# Patient Record
Sex: Female | Born: 1965 | Race: Black or African American | Hispanic: No | Smoking: Never smoker
Health system: Southern US, Community
[De-identification: ages and names within clinical notes are randomized; demographics above are authoritative.]

## PROBLEM LIST (undated history)

## (undated) DIAGNOSIS — D509 Iron deficiency anemia, unspecified: Secondary | ICD-10-CM

## (undated) DIAGNOSIS — M5010 Cervical disc disorder with radiculopathy, unspecified cervical region: Secondary | ICD-10-CM

## (undated) DIAGNOSIS — F32A Depression, unspecified: Secondary | ICD-10-CM

## (undated) DIAGNOSIS — K219 Gastro-esophageal reflux disease without esophagitis: Secondary | ICD-10-CM

## (undated) DIAGNOSIS — F5000 Anorexia nervosa, unspecified: Secondary | ICD-10-CM

## (undated) DIAGNOSIS — I10 Essential (primary) hypertension: Secondary | ICD-10-CM

## (undated) DIAGNOSIS — Z8669 Personal history of other diseases of the nervous system and sense organs: Secondary | ICD-10-CM

## (undated) DIAGNOSIS — F4310 Post-traumatic stress disorder, unspecified: Secondary | ICD-10-CM

## (undated) DIAGNOSIS — F329 Major depressive disorder, single episode, unspecified: Secondary | ICD-10-CM

## (undated) HISTORY — DX: Cervical disc disorder with radiculopathy, unspecified cervical region: M50.10

## (undated) HISTORY — DX: Depression, unspecified: F32.A

## (undated) HISTORY — DX: Personal history of other diseases of the nervous system and sense organs: Z86.69

## (undated) HISTORY — DX: Major depressive disorder, single episode, unspecified: F32.9

## (undated) HISTORY — DX: Gastro-esophageal reflux disease without esophagitis: K21.9

## (undated) HISTORY — DX: Post-traumatic stress disorder, unspecified: F43.10

## (undated) HISTORY — DX: Iron deficiency anemia, unspecified: D50.9

## (undated) HISTORY — PX: CEREBRAL ANEURYSM REPAIR: SHX164

## (undated) HISTORY — DX: Anorexia nervosa, unspecified: F50.00

---

## 2009-03-13 ENCOUNTER — Encounter: Admission: RE | Admit: 2009-03-13 | Discharge: 2009-03-13 | Payer: Self-pay | Admitting: Neurology

## 2009-08-22 ENCOUNTER — Ambulatory Visit: Payer: Self-pay | Admitting: Gastroenterology

## 2009-08-22 ENCOUNTER — Encounter: Payer: Self-pay | Admitting: Gastroenterology

## 2009-08-22 DIAGNOSIS — K219 Gastro-esophageal reflux disease without esophagitis: Secondary | ICD-10-CM | POA: Insufficient documentation

## 2009-08-22 DIAGNOSIS — Z862 Personal history of diseases of the blood and blood-forming organs and certain disorders involving the immune mechanism: Secondary | ICD-10-CM | POA: Insufficient documentation

## 2009-08-22 DIAGNOSIS — R1310 Dysphagia, unspecified: Secondary | ICD-10-CM | POA: Insufficient documentation

## 2009-08-22 DIAGNOSIS — R112 Nausea with vomiting, unspecified: Secondary | ICD-10-CM | POA: Insufficient documentation

## 2009-08-22 DIAGNOSIS — R1031 Right lower quadrant pain: Secondary | ICD-10-CM | POA: Insufficient documentation

## 2009-08-22 DIAGNOSIS — K5909 Other constipation: Secondary | ICD-10-CM | POA: Insufficient documentation

## 2009-08-24 ENCOUNTER — Encounter: Payer: Self-pay | Admitting: Gastroenterology

## 2009-08-28 ENCOUNTER — Ambulatory Visit (HOSPITAL_COMMUNITY): Admission: RE | Admit: 2009-08-28 | Discharge: 2009-08-28 | Payer: Self-pay | Admitting: Gastroenterology

## 2009-09-06 ENCOUNTER — Ambulatory Visit: Payer: Self-pay | Admitting: Gastroenterology

## 2009-09-06 ENCOUNTER — Ambulatory Visit (HOSPITAL_COMMUNITY): Admission: RE | Admit: 2009-09-06 | Discharge: 2009-09-06 | Payer: Self-pay | Admitting: Gastroenterology

## 2009-09-11 ENCOUNTER — Encounter: Payer: Self-pay | Admitting: Gastroenterology

## 2009-11-21 ENCOUNTER — Encounter (INDEPENDENT_AMBULATORY_CARE_PROVIDER_SITE_OTHER): Payer: Self-pay | Admitting: *Deleted

## 2009-11-30 ENCOUNTER — Ambulatory Visit (HOSPITAL_COMMUNITY): Admission: RE | Admit: 2009-11-30 | Discharge: 2009-11-30 | Payer: Self-pay | Admitting: Family Medicine

## 2009-11-30 ENCOUNTER — Encounter (HOSPITAL_COMMUNITY)
Admission: RE | Admit: 2009-11-30 | Discharge: 2009-12-30 | Payer: Self-pay | Source: Home / Self Care | Admitting: Gastroenterology

## 2009-12-01 ENCOUNTER — Emergency Department (HOSPITAL_COMMUNITY): Admission: EM | Admit: 2009-12-01 | Discharge: 2009-12-01 | Payer: Self-pay | Admitting: Emergency Medicine

## 2009-12-05 ENCOUNTER — Encounter (INDEPENDENT_AMBULATORY_CARE_PROVIDER_SITE_OTHER): Payer: Self-pay | Admitting: *Deleted

## 2009-12-06 ENCOUNTER — Ambulatory Visit (HOSPITAL_COMMUNITY): Admission: RE | Admit: 2009-12-06 | Discharge: 2009-12-06 | Payer: Self-pay | Admitting: Interventional Radiology

## 2009-12-10 ENCOUNTER — Encounter: Payer: Self-pay | Admitting: Interventional Radiology

## 2009-12-31 ENCOUNTER — Inpatient Hospital Stay (HOSPITAL_COMMUNITY): Admission: RE | Admit: 2009-12-31 | Discharge: 2010-01-01 | Payer: Self-pay | Admitting: Interventional Radiology

## 2010-01-01 ENCOUNTER — Encounter (INDEPENDENT_AMBULATORY_CARE_PROVIDER_SITE_OTHER): Payer: Self-pay | Admitting: Cardiovascular Disease

## 2010-01-14 ENCOUNTER — Ambulatory Visit (HOSPITAL_COMMUNITY)
Admission: RE | Admit: 2010-01-14 | Discharge: 2010-01-14 | Payer: Self-pay | Source: Home / Self Care | Attending: Interventional Radiology | Admitting: Interventional Radiology

## 2010-01-16 ENCOUNTER — Ambulatory Visit (HOSPITAL_COMMUNITY)
Admission: RE | Admit: 2010-01-16 | Discharge: 2010-01-16 | Payer: Self-pay | Source: Home / Self Care | Attending: Interventional Radiology | Admitting: Interventional Radiology

## 2010-02-23 ENCOUNTER — Encounter: Payer: Self-pay | Admitting: Family Medicine

## 2010-02-24 ENCOUNTER — Encounter: Payer: Self-pay | Admitting: Family Medicine

## 2010-02-25 ENCOUNTER — Ambulatory Visit: Admit: 2010-02-25 | Payer: Self-pay | Admitting: Gastroenterology

## 2010-03-05 NOTE — Letter (Signed)
Summary: CT order   CT order   Imported By: Peggyann Shoals 08/22/2009 15:24:14  _____________________________________________________________________  External Attachment:    Type:   Image     Comment:   External Document
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
## 2010-03-05 NOTE — Letter (Signed)
Summary: TCS & EGD order   TCS & EGD order   Imported By: Peggyann Shoals 08/22/2009 15:08:54  _____________________________________________________________________  External Attachment:    Type:   Image     Comment:   External Document

## 2010-03-05 NOTE — Letter (Signed)
Summary: Sitz Marker order   Sitz Marker order   Imported By: Peggyann Shoals 09/11/2009 11:28:08  _____________________________________________________________________  External Attachment:    Type:   Image     Comment:   External Document

## 2010-03-05 NOTE — Letter (Signed)
Summary: Radiology Test Reminder  [facility]  [address]   [place], [place] [address]   Phone: [phone]  Fax: [phone]     November 21, 2009   [facility] [address] [place], [place]  [address] 11/18/1965  Dear Ms. Nedra Hai,  During your last appointment, your doctor requested you have a Sitz Marker Study.  Our records indicate you have not had this done.  Remember it is very important to follow your doctor's instructions.  Please have this done as soon as possible.  If you have questions regarding this appointment, please call our office and we can reschedule this for you.  It is important that patients and their doctor work together in the management and treatment of their health care.  If you have already had your test done, please disregard this letter.  Thank you,    Ave Filter  [facility] Ph: [phone]   Fax: [phone]   Appended Document: Radiology Test Reminder Pt called and stated she will go to the hospital this week to have her Sitz Marker Study.Marland KitchenMarland KitchenI informed the pt she didn't need an appt for this type of test.

## 2010-03-05 NOTE — Letter (Signed)
Summary: insurance confirmation  insurance confirmation   Imported By: Minna Merritts 08/24/2009 17:38:45  _____________________________________________________________________  External Attachment:    Type:   Image     Comment:   External Document

## 2010-03-05 NOTE — Letter (Signed)
Summary: Recall Office Visit  [facility]  [address]   [place], [place] [address]   Phone: [phone]  Fax: [phone]      December 05, 2009   [facility] [place] [address] [place], [place]  [address] March 14, 1965   Dear Ms. Nedra Hai,   According to our records, it is time for you to schedule a follow-up office visit with [place].   At your convenience, please call [phone] to schedule an office visit. If you have any questions, concerns, or feel that this letter is in error, we would appreciate your call.   Sincerely,    Diana Eves  [facility] Ph: [phone]   Fax: [phone]

## 2010-03-29 ENCOUNTER — Other Ambulatory Visit (HOSPITAL_COMMUNITY): Payer: Self-pay | Admitting: Interventional Radiology

## 2010-03-29 DIAGNOSIS — I729 Aneurysm of unspecified site: Secondary | ICD-10-CM

## 2010-04-16 LAB — CARDIAC PANEL(CRET KIN+CKTOT+MB+TROPI)
Relative Index: INVALID (ref 0.0–2.5)
Relative Index: INVALID (ref 0.0–2.5)
Total CK: 49 U/L (ref 7–177)
Total CK: 53 U/L (ref 7–177)

## 2010-04-16 LAB — BASIC METABOLIC PANEL
BUN: 7 mg/dL (ref 6–23)
BUN: 8 mg/dL (ref 6–23)
CO2: 25 mEq/L (ref 19–32)
Calcium: 8.6 mg/dL (ref 8.4–10.5)
Calcium: 9.4 mg/dL (ref 8.4–10.5)
Calcium: 9.5 mg/dL (ref 8.4–10.5)
Creatinine, Ser: 0.67 mg/dL (ref 0.4–1.2)
Creatinine, Ser: 0.73 mg/dL (ref 0.4–1.2)
GFR calc Af Amer: >60 mL/min (ref >60)
GFR calc Af Amer: >60 mL/min (ref >60)
GFR calc non Af Amer: >60 mL/min (ref >60)
GFR calc non Af Amer: >60 mL/min (ref >60)
Glucose, Bld: 98 mg/dL (ref 70–99)
Sodium: 136 mEq/L (ref 135–145)
Sodium: 138 mEq/L (ref 135–145)

## 2010-04-16 LAB — DIFFERENTIAL
Basophils Absolute: 0 10*3/uL (ref 0.0–0.1)
Eosinophils Absolute: 0.1 10*3/uL (ref 0.0–0.7)
Eosinophils Relative: 1 % (ref 0–5)
Lymphocytes Relative: 46 % (ref 12–46)

## 2010-04-16 LAB — CBC
Hemoglobin: 11.4 g/dL — ABNORMAL LOW (ref 12.0–15.0)
Hemoglobin: 9.2 g/dL — ABNORMAL LOW (ref 12.0–15.0)
Hemoglobin: 9.7 g/dL — ABNORMAL LOW (ref 12.0–15.0)
MCH: 30.2 pg (ref 26.0–34.0)
MCH: 30.3 pg (ref 26.0–34.0)
MCH: 30.4 pg (ref 26.0–34.0)
MCH: 30.5 pg (ref 26.0–34.0)
MCHC: 32.4 g/dL (ref 30.0–36.0)
MCHC: 33.2 g/dL (ref 30.0–36.0)
MCV: 92.7 fL (ref 78.0–100.0)
Platelets: 343 10*3/uL (ref 150–400)
Platelets: 398 10*3/uL (ref 150–400)
Platelets: 417 10*3/uL — ABNORMAL HIGH (ref 150–400)
RBC: 3.03 MIL/uL — ABNORMAL LOW (ref 3.87–5.11)
RBC: 3.9 MIL/uL (ref 3.87–5.11)
RDW: 13.3 % (ref 11.5–15.5)
RDW: 14 % (ref 11.5–15.5)
WBC: 8.2 10*3/uL (ref 4.0–10.5)

## 2010-04-16 LAB — PROTIME-INR
Prothrombin Time: 12.2 seconds (ref 11.6–15.2)
Prothrombin Time: 12.9 seconds (ref 11.6–15.2)
Prothrombin Time: 13 seconds (ref 11.6–15.2)

## 2010-04-16 LAB — GLUCOSE, CAPILLARY: Glucose-Capillary: 105 mg/dL — ABNORMAL HIGH (ref 70–99)

## 2010-04-16 LAB — APTT: aPTT: 29 seconds (ref 24–37)

## 2010-04-17 LAB — POCT I-STAT, CHEM 8
Calcium, Ion: 1.15 mmol/L (ref 1.12–1.32)
Chloride: 106 mEq/L (ref 96–112)
HCT: 39 % (ref 36.0–46.0)
Hemoglobin: 13.3 g/dL (ref 12.0–15.0)
TCO2: 27 mmol/L (ref 0–100)

## 2010-04-17 LAB — URINALYSIS, ROUTINE W REFLEX MICROSCOPIC
Glucose, UA: NEGATIVE mg/dL
Nitrite: NEGATIVE
Protein, ur: NEGATIVE mg/dL
Urobilinogen, UA: 1 mg/dL (ref 0.0–1.0)

## 2010-04-17 LAB — POCT PREGNANCY, URINE: Preg Test, Ur: NEGATIVE

## 2010-04-18 ENCOUNTER — Other Ambulatory Visit (HOSPITAL_COMMUNITY): Payer: Self-pay | Admitting: Interventional Radiology

## 2010-04-18 ENCOUNTER — Ambulatory Visit (HOSPITAL_COMMUNITY)
Admission: RE | Admit: 2010-04-18 | Discharge: 2010-04-18 | Disposition: A | Discharge status: Home / Self Care | Payer: Medicare Other | Source: Ambulatory Visit | Attending: Interventional Radiology | Admitting: Interventional Radiology

## 2010-04-18 ENCOUNTER — Other Ambulatory Visit (HOSPITAL_COMMUNITY): Payer: Self-pay

## 2010-04-18 DIAGNOSIS — I671 Cerebral aneurysm, nonruptured: Secondary | ICD-10-CM | POA: Insufficient documentation

## 2010-04-18 DIAGNOSIS — K219 Gastro-esophageal reflux disease without esophagitis: Secondary | ICD-10-CM | POA: Insufficient documentation

## 2010-04-18 DIAGNOSIS — I729 Aneurysm of unspecified site: Secondary | ICD-10-CM

## 2010-04-18 DIAGNOSIS — I10 Essential (primary) hypertension: Secondary | ICD-10-CM | POA: Insufficient documentation

## 2010-04-18 DIAGNOSIS — R51 Headache: Secondary | ICD-10-CM | POA: Insufficient documentation

## 2010-04-18 DIAGNOSIS — R42 Dizziness and giddiness: Secondary | ICD-10-CM | POA: Insufficient documentation

## 2010-04-18 LAB — PROTIME-INR
INR: 0.87 (ref 0.00–1.49)
Prothrombin Time: 12 seconds (ref 11.6–15.2)

## 2010-04-18 LAB — CBC
Platelets: 371 10*3/uL (ref 150–400)
RDW: 13.1 % (ref 11.5–15.5)
WBC: 8.6 10*3/uL (ref 4.0–10.5)

## 2010-04-18 LAB — COMPREHENSIVE METABOLIC PANEL
Albumin: 4 g/dL (ref 3.5–5.2)
BUN: 9 mg/dL (ref 6–23)
Creatinine, Ser: 0.75 mg/dL (ref 0.4–1.2)
Glucose, Bld: 101 mg/dL — ABNORMAL HIGH (ref 70–99)
Total Protein: 7.2 g/dL (ref 6.0–8.3)

## 2010-04-18 LAB — APTT: aPTT: 26 seconds (ref 24–37)

## 2010-04-18 MED ORDER — IOHEXOL 300 MG/ML  SOLN
150.0000 mL | Freq: Once | INTRAMUSCULAR | Status: AC | PRN
  Administered 2010-04-18: 40 mL via INTRAVENOUS

## 2010-04-20 LAB — BASIC METABOLIC PANEL
BUN: 14 mg/dL (ref 6–23)
Calcium: 9.2 mg/dL (ref 8.4–10.5)
Creatinine, Ser: 0.87 mg/dL (ref 0.4–1.2)
GFR calc non Af Amer: >60 mL/min (ref >60)
Glucose, Bld: 105 mg/dL — ABNORMAL HIGH (ref 70–99)

## 2010-06-10 ENCOUNTER — Ambulatory Visit (INDEPENDENT_AMBULATORY_CARE_PROVIDER_SITE_OTHER): Payer: Medicare Other | Admitting: Gastroenterology

## 2010-06-10 ENCOUNTER — Encounter: Payer: Self-pay | Admitting: Gastroenterology

## 2010-06-10 VITALS — BP 141/68 | HR 95 | Temp 97.6°F | Ht 61.0 in | Wt 161.0 lb

## 2010-06-10 DIAGNOSIS — K5909 Other constipation: Secondary | ICD-10-CM

## 2010-06-10 DIAGNOSIS — A048 Other specified bacterial intestinal infections: Secondary | ICD-10-CM

## 2010-06-10 MED ORDER — OMEPRAZOLE 20 MG PO CPDR: 20.0000 mg | DELAYED_RELEASE_CAPSULE | Freq: Every day | ORAL | Status: AC

## 2010-06-10 NOTE — Assessment & Plan Note (Signed)
Chronic constipation despite Amitiza 24 mcg BID, laxatives, high fiber diet. Did not complete sitz marker study secondary to cerebral aneurysm repair. Will proceed with sitz marker study, add probiotic. Further rec's to follow.

## 2010-06-10 NOTE — Patient Instructions (Signed)
We have set you up for a sitz marker study. We will contact you with results when this is all completed.  Continue the high fiber diet.  Set a goal for 6-8 glasses of water per day.  Start a probiotic: samples have been given to you. (Such as Align, Restora).  Follow-up in 3 months.

## 2010-06-10 NOTE — Progress Notes (Signed)
Referring Provider: Reynolds Bowl, MD Primary Care Physician:  Michele Bowl, MD Primary Gastroenterologist: Dr. Darrick Penna   Chief Complaint  Patient presents with  . Constipation    HPI:   Michele Barrett returns today in f/u for chronic constipation. EGD/TCS performed August 2011: +H.pylori s/p dilation of Schatzki's ring, hyperplastic polyps, internal hemorrhoids. Was set up for sitz marker study but never completed due to finding of cerebral aneurysm. Actually underwent cerebral aneurysm repair in Dec 2011. Returns today without any improvement of constipation Taking Amitiza twice/day. Taking 4 dulcolax pills every night. Correctol once/week (8-10 pills). Doesn't have BM unless takes Correctol.  In mornings feels nauseated, lasts as long as constipated. No dysphagia or GERD. Has cramping secondary to constipation. Following high fiber diet. Taking metamucil. Tries to drink 5 glasses of water/day. Not on a probiotic.   Complains of epigastric pain like a ball in the morning and evening/cramping. "achy". Chewing gum makes it feel better.   Past Medical History  Diagnosis Date  . Vitamin D deficiency   . GERD (gastroesophageal reflux disease)   . Cervical disc syndrome   . Anorexia nervosa     6 years ago diagnosed. wt stable,  . Depression   . PTSD (post-traumatic stress disorder)     since sister's death in 17-Jun-1992 (aneurysm)  . IDA (iron deficiency anemia)   . History of migraine headaches     Past Surgical History  Procedure Date  . Cesarean section     uterus stapled to abd wall, resulting in surgery few days later; cardiac arrest intraop  . Cerebral aneurysm repair     Dec 2011    Current Outpatient Prescriptions  Medication Sig Dispense Refill  . Almotriptan Malate (AXERT PO) Take 0.5 mg by mouth.        . ALPRAZolam (XANAX) 0.5 MG tablet Take 0.5 mg by mouth at bedtime as needed.        Marland Kitchen amitriptyline (ELAVIL) 100 MG tablet Take 100 mg by mouth at bedtime.        Marland Kitchen  aspirin 325 MG tablet Take 325 mg by mouth daily.        . bisacodyl (BISACODYL) 5 MG EC tablet Take 5 mg by mouth daily as needed.        Marland Kitchen buPROPion (WELLBUTRIN SR) 100 MG 12 hr tablet Take 100 mg by mouth 2 (two) times daily.        Marland Kitchen LISINOPRIL PO Take 75 mg by mouth.        . meclizine (ANTIVERT) 25 MG tablet Take 25 mg by mouth 3 (three) times daily as needed.          Allergies as of 06/10/2010  . (No Known Allergies)    Family History  Problem Relation Age of Onset  . Colon cancer Mother 16  . Colon cancer Father 30  . Breast cancer Maternal Aunt   . Cerebral aneurysm Sister 9    History   Social History  . Marital Status: Married    Spouse Name: N/A    Number of Children: N/A  . Years of Education: N/A   Social History Main Topics  . Smoking status: Never Smoker   . Smokeless tobacco: None  . Alcohol Use: No  . Drug Use: No  . Sexually Active: None    Review of Systems: Gen: Denies fever, chills, anorexia. Denies fatigue, weakness, weight loss.  CV: Denies chest pain, palpitations, syncope, peripheral edema, and claudication. Resp: Denies dyspnea at rest,  cough, wheezing, coughing up blood, and pleurisy. GI: Denies vomiting blood, jaundice, and fecal incontinence.   Denies dysphagia or odynophagia. Derm: Denies rash, itching, dry skin Psych: Denies depression, anxiety, memory loss, confusion. No homicidal or suicidal ideation.  Heme: Denies bruising, bleeding, and enlarged lymph nodes.  Physical Exam: BP 141/68  Pulse 95  Temp(Src) 97.6 F (36.4 C) (Tympanic)  Ht 5\' 1"  (1.549 m)  Wt 161 lb (73.029 kg)  BMI 30.42 kg/m2  LMP 05/29/2010 General:   Alert and oriented. No distress noted. Pleasant and cooperative.  Head:  Normocephalic and atraumatic. Eyes:  Conjuctiva clear without scleral icterus. Mouth:  Oral mucosa pink and moist. Good dentition. No lesions. Heart:  S1, S2 present without murmurs, rubs, or gallops. Regular rate and rhythm. Lungs:  Clear to auscultation bilaterally without wheezes, rales, or rhonchi.  Abdomen:  +BS, soft, non-tender and non-distended. No rebound or guarding. No HSM or masses noted. Msk:  Symmetrical without gross deformities. Normal posture. Extremities:  Without edema. Neurologic:  Alert and  oriented x4;  grossly normal neurologically. Skin:  Intact without significant lesions or rashes. Cervical Nodes:  No significant cervical adenopathy. Psych:  Alert and cooperative. Normal mood and affect.

## 2010-06-10 NOTE — Progress Notes (Signed)
Cc to PCP 

## 2010-06-10 NOTE — Assessment & Plan Note (Signed)
Hx of H.pylori gastritis on EGD Aug 2011, completed tx. Returns today off PPI. Complains of epigastric crampy/achy sensation in morning and night. No dysphagia. No GERD. +nausea in morning. Nausea likely r/t constipation component, as this is relieved after BM. However, may also be linked to hx of gastritis. Will check H.pylori stool antigen, then resume PPI daily. F/U in 3 mos with Dr. Darrick Penna.

## 2010-06-21 NOTE — Progress Notes (Signed)
Agree with Sitz Marker. Continue Am itiza.

## 2010-07-02 ENCOUNTER — Encounter: Payer: Self-pay | Admitting: Gastroenterology

## 2010-07-02 NOTE — Progress Notes (Unsigned)
  Pt did not complete H.pylori stool antigen or sitz marker study. I believe this was set up at last appt.  Please have pt complete the stool sample: needs to be off PPI for 2 weeks prior to submitting so that results are not skewed.  Also, sitz marker study needs to be completed.

## 2010-07-02 NOTE — Progress Notes (Signed)
Contacting pt to determine why not completed.

## 2010-07-03 NOTE — Progress Notes (Signed)
LMOM to call.

## 2010-07-03 NOTE — Progress Notes (Signed)
Called, no answer.

## 2010-07-04 NOTE — Progress Notes (Signed)
Attempted to call, all circuits are busy.

## 2010-07-08 NOTE — Progress Notes (Signed)
Pt said she has been off of the PPI. She has not had much BM, is going to take a laxative tonight and see if she can get a stool specimen. Said she was told not to do the Sitz marker until after she completes the H. Pylori specimen.

## 2010-07-08 NOTE — Progress Notes (Signed)
LMOM to call. Mailing a letter to call also.  

## 2010-08-14 ENCOUNTER — Other Ambulatory Visit (HOSPITAL_COMMUNITY): Payer: Self-pay | Admitting: Interventional Radiology

## 2010-08-14 DIAGNOSIS — I729 Aneurysm of unspecified site: Secondary | ICD-10-CM

## 2010-08-20 ENCOUNTER — Ambulatory Visit (HOSPITAL_COMMUNITY)
Admission: RE | Admit: 2010-08-20 | Discharge: 2010-08-20 | Disposition: A | Discharge status: Home / Self Care | Payer: Medicare Other | Source: Ambulatory Visit | Attending: Interventional Radiology | Admitting: Interventional Radiology

## 2010-08-20 ENCOUNTER — Other Ambulatory Visit (HOSPITAL_COMMUNITY): Payer: Self-pay | Admitting: Interventional Radiology

## 2010-08-20 DIAGNOSIS — K219 Gastro-esophageal reflux disease without esophagitis: Secondary | ICD-10-CM | POA: Insufficient documentation

## 2010-08-20 DIAGNOSIS — G43909 Migraine, unspecified, not intractable, without status migrainosus: Secondary | ICD-10-CM | POA: Insufficient documentation

## 2010-08-20 DIAGNOSIS — I671 Cerebral aneurysm, nonruptured: Secondary | ICD-10-CM | POA: Insufficient documentation

## 2010-08-20 DIAGNOSIS — D649 Anemia, unspecified: Secondary | ICD-10-CM | POA: Insufficient documentation

## 2010-08-20 DIAGNOSIS — T82898A Other specified complication of vascular prosthetic devices, implants and grafts, initial encounter: Secondary | ICD-10-CM | POA: Insufficient documentation

## 2010-08-20 DIAGNOSIS — I10 Essential (primary) hypertension: Secondary | ICD-10-CM | POA: Insufficient documentation

## 2010-08-20 DIAGNOSIS — I729 Aneurysm of unspecified site: Secondary | ICD-10-CM

## 2010-08-20 DIAGNOSIS — F411 Generalized anxiety disorder: Secondary | ICD-10-CM | POA: Insufficient documentation

## 2010-08-20 DIAGNOSIS — M129 Arthropathy, unspecified: Secondary | ICD-10-CM | POA: Insufficient documentation

## 2010-08-20 DIAGNOSIS — Z9889 Other specified postprocedural states: Secondary | ICD-10-CM | POA: Insufficient documentation

## 2010-08-20 DIAGNOSIS — Y831 Surgical operation with implant of artificial internal device as the cause of abnormal reaction of the patient, or of later complication, without mention of misadventure at the time of the procedure: Secondary | ICD-10-CM | POA: Insufficient documentation

## 2010-08-20 LAB — POCT I-STAT, CHEM 8
Chloride: 106 mEq/L (ref 96–112)
Creatinine, Ser: 0.7 mg/dL (ref 0.50–1.10)
Glucose, Bld: 101 mg/dL — ABNORMAL HIGH (ref 70–99)
HCT: 38 % (ref 36.0–46.0)
Potassium: 3.4 mEq/L — ABNORMAL LOW (ref 3.5–5.1)
Sodium: 140 mEq/L (ref 135–145)

## 2010-08-20 LAB — CBC
HCT: 33.4 % — ABNORMAL LOW (ref 36.0–46.0)
Hemoglobin: 11.1 g/dL — ABNORMAL LOW (ref 12.0–15.0)
MCHC: 33.2 g/dL (ref 30.0–36.0)
MCV: 90.3 fL (ref 78.0–100.0)
RDW: 13.2 % (ref 11.5–15.5)

## 2010-08-20 MED ORDER — IOHEXOL 300 MG/ML  SOLN
150.0000 mL | Freq: Once | INTRAMUSCULAR | Status: AC | PRN
  Administered 2010-08-20: 73 mL via INTRAVENOUS

## 2010-09-10 ENCOUNTER — Encounter: Payer: Self-pay | Admitting: Gastroenterology

## 2010-09-11 ENCOUNTER — Ambulatory Visit: Payer: Medicare Other | Admitting: Gastroenterology

## 2010-09-11 ENCOUNTER — Telehealth: Payer: Self-pay | Admitting: Gastroenterology

## 2010-09-11 NOTE — Telephone Encounter (Signed)
NOTED

## 2012-03-09 IMAGING — XA IR ANGIO/CAROTID/CERV BI
1 series · 13 of 24 positions shown · IV contrast (IODINE)
Comparison: None.

12/10/2009 - DUPLICATE COPY for exam association in [facility] – No change from original report.
CLINICAL DATA: Severe right-sided headaches. Abnormal MRA of the
 brain. Strong family history of intracranial aneurysms.

 BILATERAL CAROTID ARTERIOGRAPHY AND BILATERAL VERTEBRAL ARTERY
 angiograms.

[Series 300: neuro · 13 of 246 slices shown]
[im 1/246]
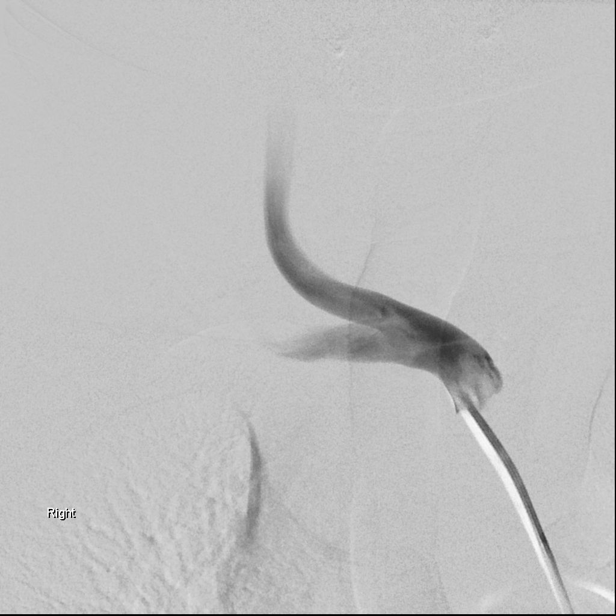
[im 22/246]
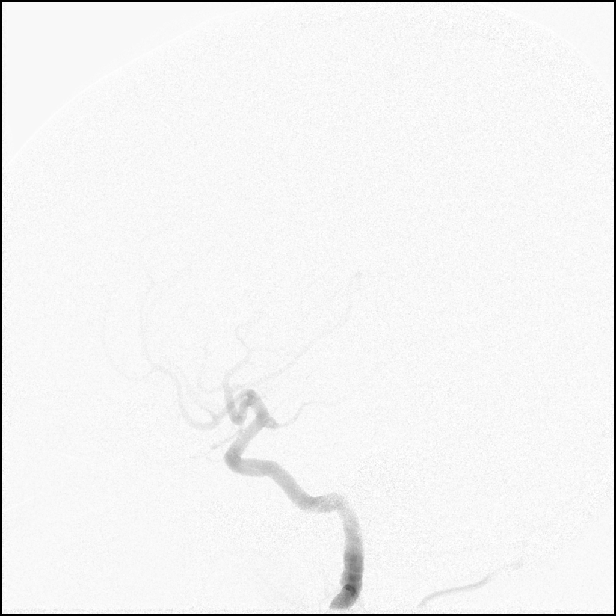
[im 43/246]
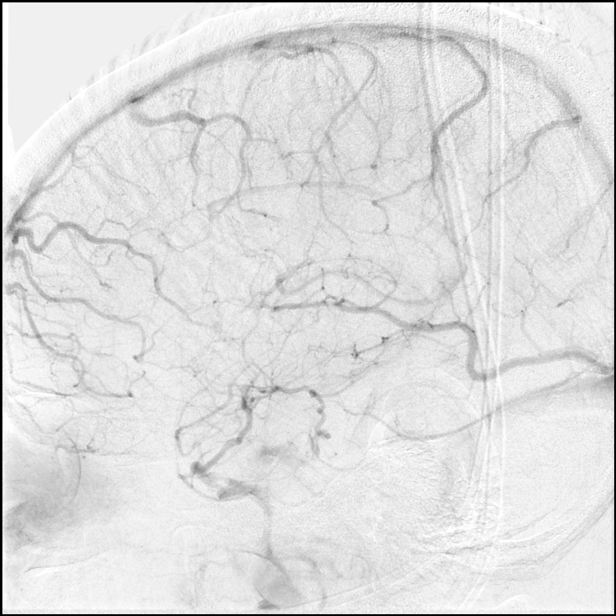
[im 64/246]
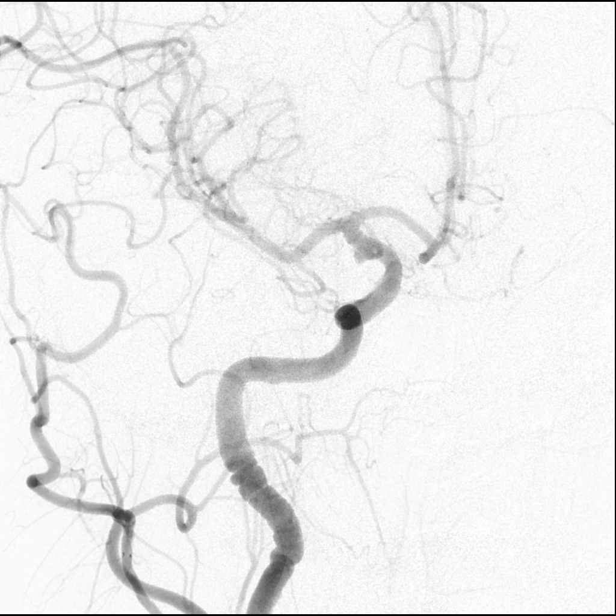
[im 86/246]
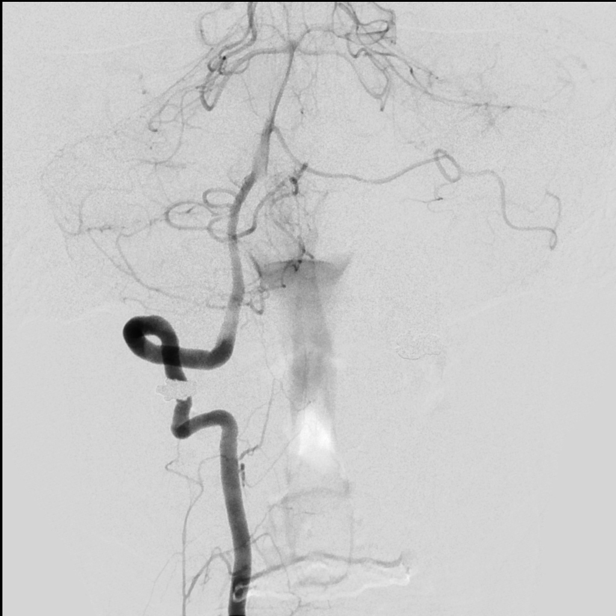
[im 107/246]
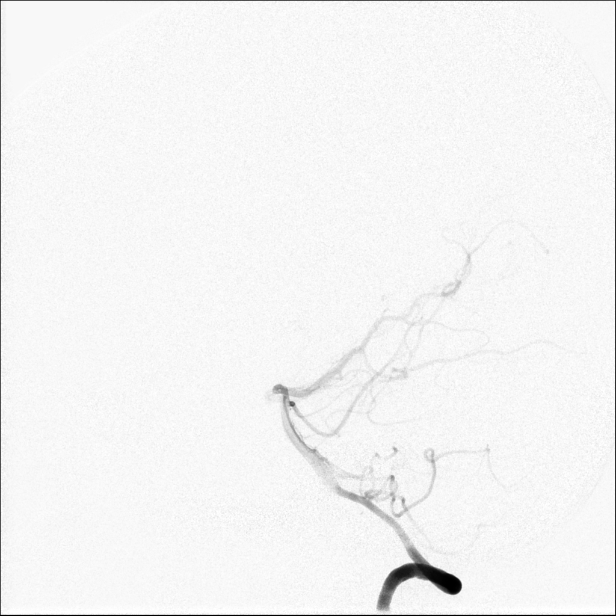
[im 128/246]
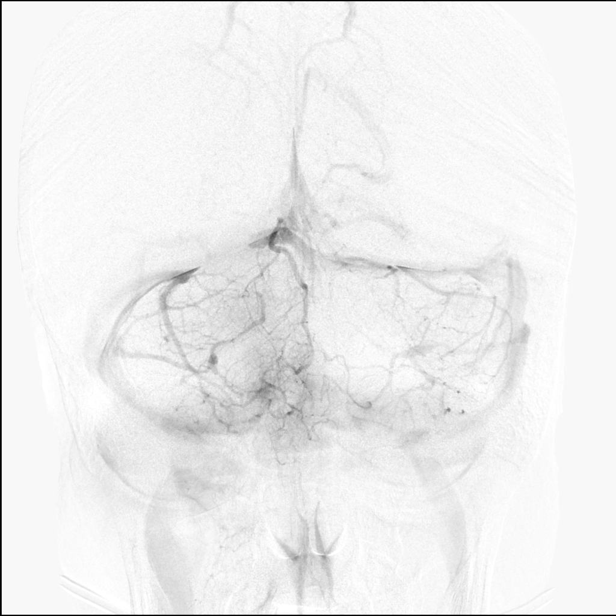
[im 139/246]
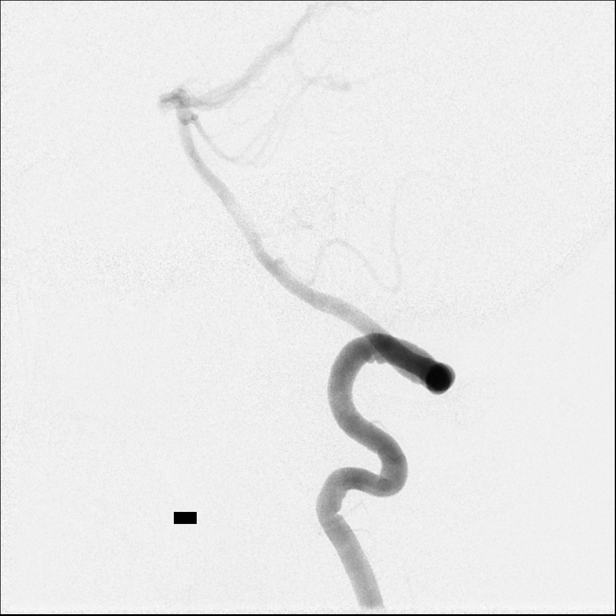
[im 160/246]
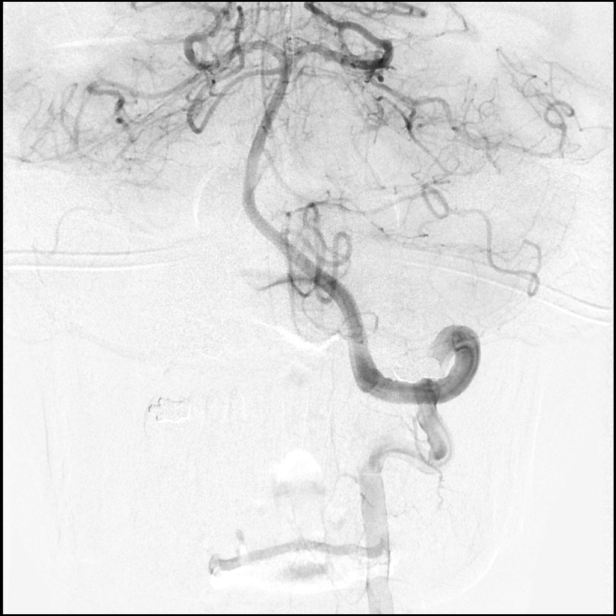
[im 182/246]
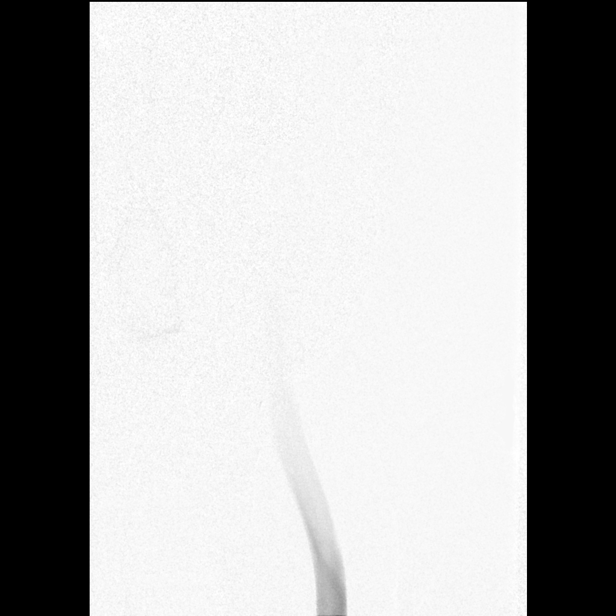
[im 203/246]
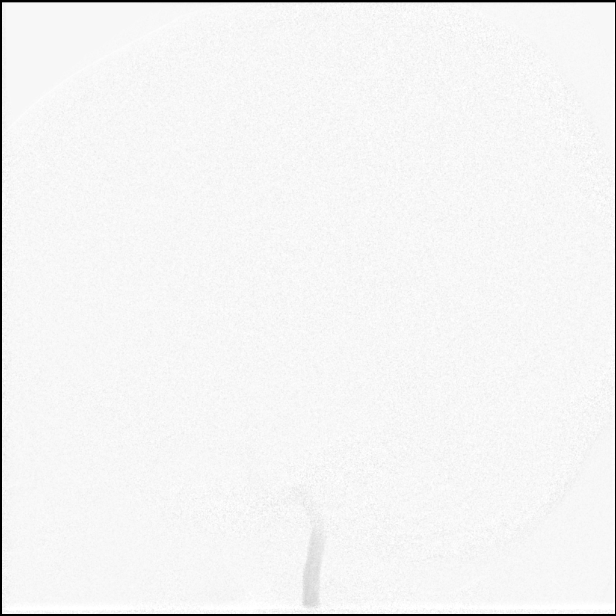
[im 224/246]
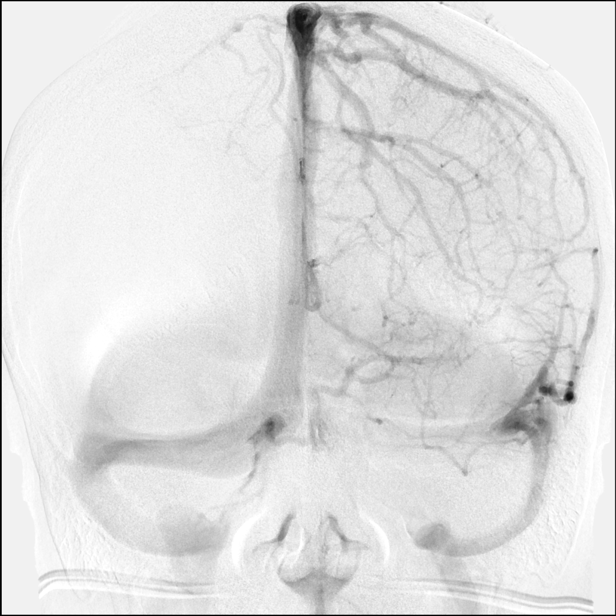
[im 246/246]
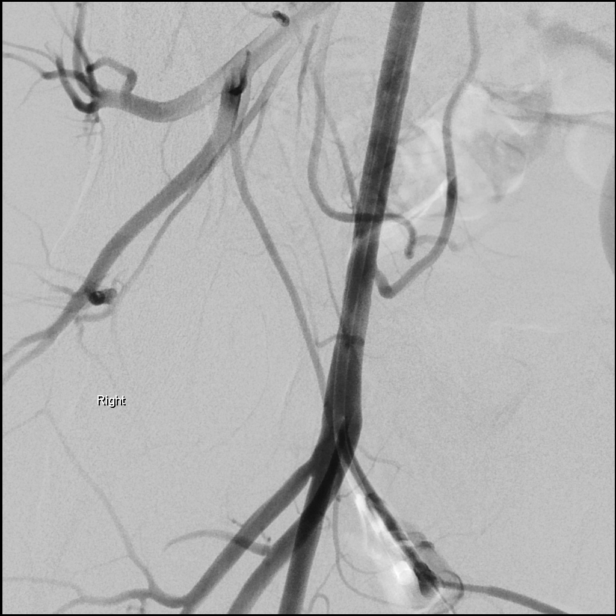

[13 of 24 positions shown; findings below may reference images not displayed]

MRI MRA of the brain of 11/30/2009.

 Following full explanation of procedure along with the potential
 cyst complications, an informed witnessed consent was obtained

 The right groin was prepped and draped in the usual sterile fashion
 thereafter using modified Seldinger technique, transfemoral access
 into the right common femoral artery was obtained without
 difficulty. Over a 0.035-inch guidewire a 5-French Pinnacle sheath
 was inserted. Through this and also over a 0.035-inch guidewire, 5
 French angiogram catheter was advanced to the aortic arch region
 and slight positioned in the right common carotid artery, the right
 vertebral artery, the left common carotid artery and the left
 vertebral artery.

 There were no acute complications. The the patient tolerated the
 procedure well

 Medications utilized Versed 1 mg IV. Fentanyl 25 mcg IV. Contrast
 Omnipaque approximately 65 ml.
FINDINGS: The right common carotid and demonstrates the right external
 carotid artery its major branches to be normal

 The right internal carotid at the bulb in its proximal to thirds is
 normal

 The distal one thirdhas a focal area of smooth irregularity without
 associated stenoses representing mild FMD.

 Distal to this the vessel is seen to opacify normally into the
 petrous cavernous and supraclinoid segments.

 Arising in the right posterior communicating region is a saccular
 aneurysm projecting posteriorly measuring approximately 3.5 mm x 3
 mm. A right posterior communicating artery is seen to arise
 separately from the neck of the aneurysm inferomedially.

 The right middle and the right anterior cerebral arteries are seen
 to opacify normally into the capillary and venous phases

 Cross opacification via the anterior communicating artery of the
 left anterior cerebral A2 segment is noted

 The right vertebral artery origin is normal. The vessel opacifies
 normally to the cranial skull base

 There is normal opacification of the right post inferior cerebellar
 and the right vertebrobasilar junction

 The basilar artery, the opacified portions of the posterior
 cerebral arteries, superior cerebellar arteries and the anterior-
 inferior cerebellar arteries is normal into capillary venous
 phases. Unopacified blood is seen in the basilar artery from a
 contralateral vertebral artery

 The left vertebral artery origin is normal. The vessel opacifies
 normally to the cranial skull base

 The left post inferior cerebellar artery and the left
 vertebrobasilar junction are normal

 The basilar artery, the posterior cerebral arteries, the superior
 cerebellar arteries and the anterior-inferior cerebellar arteries
 opacify normally into capillary venous phases

 The left common carotid demonstrates the left external carotid
 artery and major branches to be normal. The left internal carotid
 at the bulb to the cranial skull base is normal

 The petrous, the cavernous and the supraclinoid segments are
 normally opacified

 At the left posterior communicating artery is noted with associated
 small infundibulum.

 The left middle and the left anterior cerebral arteries opacify
 normally into the capillary and venous phases.

 Impression
 1. Approximately 3.5 mm x 3 mm saccular aneurysm arising in the
 right posterior communicating artery region.

 The above findings were discussed with the patient and the
 patient's family.

## 2012-12-31 ENCOUNTER — Ambulatory Visit (HOSPITAL_COMMUNITY)
Admission: RE | Admit: 2012-12-31 | Discharge: 2012-12-31 | Disposition: A | Discharge status: Home / Self Care | Payer: Medicare Other | Source: Ambulatory Visit | Attending: Interventional Radiology | Admitting: Interventional Radiology

## 2012-12-31 ENCOUNTER — Other Ambulatory Visit (HOSPITAL_COMMUNITY): Payer: Self-pay | Admitting: Interventional Radiology

## 2012-12-31 DIAGNOSIS — R42 Dizziness and giddiness: Secondary | ICD-10-CM

## 2012-12-31 DIAGNOSIS — IMO0001 Reserved for inherently not codable concepts without codable children: Secondary | ICD-10-CM

## 2012-12-31 DIAGNOSIS — R202 Paresthesia of skin: Secondary | ICD-10-CM

## 2012-12-31 DIAGNOSIS — R519 Headache, unspecified: Secondary | ICD-10-CM

## 2012-12-31 DIAGNOSIS — R200 Anesthesia of skin: Secondary | ICD-10-CM

## 2012-12-31 DIAGNOSIS — I729 Aneurysm of unspecified site: Secondary | ICD-10-CM

## 2013-01-04 ENCOUNTER — Other Ambulatory Visit (HOSPITAL_COMMUNITY): Payer: Self-pay | Admitting: Interventional Radiology

## 2013-01-04 DIAGNOSIS — R519 Headache, unspecified: Secondary | ICD-10-CM

## 2013-01-04 DIAGNOSIS — R42 Dizziness and giddiness: Secondary | ICD-10-CM

## 2013-01-05 ENCOUNTER — Other Ambulatory Visit: Payer: Self-pay | Admitting: Radiology

## 2013-01-06 ENCOUNTER — Other Ambulatory Visit: Payer: Self-pay | Admitting: Radiology

## 2013-01-07 ENCOUNTER — Ambulatory Visit (HOSPITAL_COMMUNITY)
Admission: RE | Admit: 2013-01-07 | Discharge: 2013-01-07 | Disposition: A | Discharge status: Home / Self Care | Payer: Medicare Other | Source: Ambulatory Visit | Attending: Interventional Radiology | Admitting: Interventional Radiology

## 2013-01-07 ENCOUNTER — Encounter (HOSPITAL_COMMUNITY): Payer: Self-pay

## 2013-01-07 ENCOUNTER — Other Ambulatory Visit (HOSPITAL_COMMUNITY): Payer: Self-pay | Admitting: Interventional Radiology

## 2013-01-07 DIAGNOSIS — K219 Gastro-esophageal reflux disease without esophagitis: Secondary | ICD-10-CM | POA: Insufficient documentation

## 2013-01-07 DIAGNOSIS — I671 Cerebral aneurysm, nonruptured: Secondary | ICD-10-CM | POA: Insufficient documentation

## 2013-01-07 DIAGNOSIS — R42 Dizziness and giddiness: Secondary | ICD-10-CM

## 2013-01-07 DIAGNOSIS — R51 Headache: Secondary | ICD-10-CM | POA: Insufficient documentation

## 2013-01-07 DIAGNOSIS — I10 Essential (primary) hypertension: Secondary | ICD-10-CM | POA: Insufficient documentation

## 2013-01-07 DIAGNOSIS — R519 Headache, unspecified: Secondary | ICD-10-CM

## 2013-01-07 DIAGNOSIS — Z9889 Other specified postprocedural states: Secondary | ICD-10-CM | POA: Insufficient documentation

## 2013-01-07 DIAGNOSIS — I651 Occlusion and stenosis of basilar artery: Secondary | ICD-10-CM | POA: Insufficient documentation

## 2013-01-07 DIAGNOSIS — R112 Nausea with vomiting, unspecified: Secondary | ICD-10-CM | POA: Insufficient documentation

## 2013-01-07 LAB — BASIC METABOLIC PANEL
BUN: 14 mg/dL (ref 6–23)
CO2: 27 mEq/L (ref 19–32)
Calcium: 9.3 mg/dL (ref 8.4–10.5)
Chloride: 104 mEq/L (ref 96–112)
Creatinine, Ser: 0.91 mg/dL (ref 0.50–1.10)
GFR calc non Af Amer: 73 mL/min — ABNORMAL LOW (ref >90)

## 2013-01-07 LAB — CBC
HCT: 34.7 % — ABNORMAL LOW (ref 36.0–46.0)
MCH: 30.4 pg (ref 26.0–34.0)
MCHC: 33.4 g/dL (ref 30.0–36.0)
MCV: 91.1 fL (ref 78.0–100.0)
Platelets: 346 10*3/uL (ref 150–400)
RBC: 3.81 MIL/uL — ABNORMAL LOW (ref 3.87–5.11)
RDW: 14 % (ref 11.5–15.5)
WBC: 7.2 10*3/uL (ref 4.0–10.5)

## 2013-01-07 MED ORDER — HYDRALAZINE HCL 20 MG/ML IJ SOLN
INTRAMUSCULAR | Status: AC | PRN
  Administered 2013-01-07 (×3): 5 mg via INTRAVENOUS

## 2013-01-07 MED ORDER — MIDAZOLAM HCL 2 MG/2ML IJ SOLN
INTRAMUSCULAR | Status: AC | PRN
  Administered 2013-01-07 (×2): 1 mg via INTRAVENOUS

## 2013-01-07 MED ORDER — FENTANYL CITRATE 0.05 MG/ML IJ SOLN
INTRAMUSCULAR | Status: AC | PRN
  Administered 2013-01-07 (×2): 25 ug via INTRAVENOUS

## 2013-01-07 MED ORDER — ONDANSETRON HCL 4 MG/2ML IJ SOLN
INTRAMUSCULAR | Status: AC
  Administered 2013-01-07: 4 mg via INTRAVENOUS
  Filled 2013-01-07: qty 2

## 2013-01-07 MED ORDER — SODIUM CHLORIDE 0.9 % IV SOLN: INTRAVENOUS | Status: AC

## 2013-01-07 MED ORDER — SODIUM CHLORIDE 0.9 % IV SOLN: Freq: Once | INTRAVENOUS | Status: DC

## 2013-01-07 MED ORDER — HEPARIN SODIUM (PORCINE) 1000 UNIT/ML IJ SOLN
INTRAMUSCULAR | Status: AC | PRN
  Administered 2013-01-07: 1000 [IU] via INTRAVENOUS
  Administered 2013-01-07: 500 [IU] via INTRAVENOUS

## 2013-01-07 MED ORDER — HYDRALAZINE HCL 20 MG/ML IJ SOLN
INTRAMUSCULAR | Status: AC
  Filled 2013-01-07: qty 1

## 2013-01-07 MED ORDER — FENTANYL CITRATE 0.05 MG/ML IJ SOLN
INTRAMUSCULAR | Status: AC
  Filled 2013-01-07: qty 2

## 2013-01-07 MED ORDER — MIDAZOLAM HCL 2 MG/2ML IJ SOLN
INTRAMUSCULAR | Status: AC
  Filled 2013-01-07: qty 2

## 2013-01-07 MED ORDER — PROMETHAZINE HCL 25 MG/ML IJ SOLN
12.5000 mg | Freq: Once | INTRAMUSCULAR | Status: AC
  Administered 2013-01-07: 12.5 mg via INTRAVENOUS
  Filled 2013-01-07: qty 1

## 2013-01-07 MED ORDER — ONDANSETRON HCL 4 MG/2ML IJ SOLN: 4.0000 mg | Freq: Once | INTRAMUSCULAR | Status: DC

## 2013-01-07 MED ORDER — IOHEXOL 300 MG/ML  SOLN
150.0000 mL | Freq: Once | INTRAMUSCULAR | Status: AC | PRN
  Administered 2013-01-07: 75 mL via INTRA_ARTERIAL

## 2013-01-07 NOTE — H&P (Signed)
Michele Barrett is an 47 y.o. female.   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 Vitamin D deficiency   . GERD (gastroesophageal reflux disease)   . Cervical disc syndrome   . Anorexia nervosa     6 years ago diagnosed. wt stable,  . Depression   . PTSD (post-traumatic stress disorder)     since sister's death in 06/15/1992 (aneurysm)  . IDA (iron deficiency anemia)   . History of migraine headaches     Past Surgical History  Procedure Laterality Date  . Cesarean section      uterus stapled to abd wall, resulting in surgery few days later; cardiac arrest intraop  . Cerebral aneurysm repair      Dec 2011    Family History  Problem Relation Age of Onset  . Colon cancer Mother 89  . Colon cancer Father 64  . Breast cancer Maternal Aunt   . Cerebral aneurysm Sister 9   Social History:  reports that she has never smoked. She does not have any smokeless tobacco history on file. She reports that she does not drink alcohol or use illicit drugs.  Allergies:  Allergies  Allergen Reactions  . Aspirin Nausea And Vomiting     (Not in a hospital admission)  Results for orders placed during the hospital encounter of 01/07/13 (from the past 48 hour(s))  APTT     Status: None   Collection Time    01/07/13  7:55 AM      Result Value Range   aPTT 28  24 - 37 seconds  CBC     Status: Abnormal   Collection Time    01/07/13  7:55 AM      Result Value Range   WBC 7.2  4.0 - 10.5 K/uL   RBC 3.81 (*) 3.87 - 5.11 MIL/uL   Hemoglobin 11.6 (*) 12.0 - 15.0 g/dL   HCT 21.3 (*) 08.6 - 57.8  %   MCV 91.1  78.0 - 100.0 fL   MCH 30.4  26.0 - 34.0 pg   MCHC 33.4  30.0 - 36.0 g/dL   RDW 46.9  62.9 - 52.8 %   Platelets 346  150 - 400 K/uL  PROTIME-INR     Status: None   Collection Time    01/07/13  7:55 AM      Result Value Range   Prothrombin Time 11.7  11.6 - 15.2 seconds   INR 0.87  0.00 - 1.49   No results found.  Review of Systems  Constitutional: Negative for fever and weight loss.  Eyes: Negative for blurred vision.  Respiratory: Negative for sputum production and shortness of breath.   Cardiovascular: Negative for chest pain.  Gastrointestinal: Negative for nausea and vomiting.  Neurological: Positive for dizziness, weakness and headaches.    Blood pressure 136/84, pulse 83, temperature 97.9 F (36.6 C), temperature source Oral, resp. rate 18, height 5' (1.524 m), weight 160 lb (72.576 kg), SpO2 97.00%. Physical Exam  Constitutional: She is oriented to person, place, and time. She appears well-developed and well-nourished.  Cardiovascular: Normal rate,  regular rhythm and normal heart sounds.   No murmur heard. Respiratory: Effort normal and breath sounds normal. She has no wheezes.  GI: Soft. Bowel sounds are normal. She exhibits no distension. There is no tenderness.  Musculoskeletal: Normal range of motion.  Neurological: She is alert and oriented to person, place, and time. Coordination normal.  Skin: Skin is warm and dry.  Psychiatric: She has a normal mood and affect. Her behavior is normal. Judgment and thought content normal.     Assessment/Plan Headaches and N/V x 2 weeks High BP x 2 months despite meds [facility] ER visit last week MRI/MRA reveals new L carotic artery intracranial stenosis  Pt with known hx R PCOM artery aneurysm coiling/stent 2011 PMD has seen pt and changed BP meds---now much better  Scheduled now for cerebral arteriogram to eval L carotid intracranial stenosis Pt aware of procedure benefits and risks and agreeable to  proceed Consent signed and in chart  Michele Barrett A 01/07/2013, 8:41 AM

## 2013-01-07 NOTE — ED Notes (Signed)
Instructed per MD to admin Versed 1 mg & Fentanyl 25 mcg IV in attempt to assist with lowering BP as hydralizine alone has not worked.  Additional RX had to be pulled to do as earlier remaining sedation RX was wasted at end of procedure.

## 2013-01-07 NOTE — ED Notes (Signed)
Vital signs stable. 

## 2013-01-07 NOTE — ED Notes (Signed)
Nausea remains

## 2013-01-07 NOTE — ED Notes (Signed)
Pt complain of nausea.  Order requested and received for Zofran 4 mg IV.  Given

## 2013-01-07 NOTE — Procedures (Signed)
S/P 4 vessle cerebral arteriogram RT CFa approach  Findings. 1.Obliterated RT PCom aneurysm,with no coil compaction or recanalization. 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

## 2013-01-07 NOTE — ED Notes (Signed)
Patient denies pain and is resting comfortably.  

## 2013-01-10 ENCOUNTER — Telehealth (HOSPITAL_COMMUNITY): Payer: Self-pay | Admitting: *Deleted

## 2013-01-10 NOTE — Telephone Encounter (Signed)
Post Procedure follow up call.  Spoke with pt.  Says still experiencing some nausea but much improved.  BP up this am, will call to schedule F/U appt with PCP to address this week

## 2013-01-12 ENCOUNTER — Other Ambulatory Visit (HOSPITAL_COMMUNITY): Payer: Self-pay | Admitting: Interventional Radiology

## 2013-01-12 DIAGNOSIS — I729 Aneurysm of unspecified site: Secondary | ICD-10-CM

## 2013-01-12 DIAGNOSIS — I771 Stricture of artery: Secondary | ICD-10-CM

## 2013-01-17 ENCOUNTER — Telehealth (HOSPITAL_COMMUNITY): Payer: Self-pay | Admitting: Interventional Radiology

## 2013-01-17 NOTE — Telephone Encounter (Signed)
Called pt left VM that per Deveswhar she did not need to come in for a 2 wk f/u appt. Told her to call us back immediately is sx developed otherwise I would contact her next year for her f/u MRI/MRA. JM

## 2013-01-20 ENCOUNTER — Ambulatory Visit (HOSPITAL_COMMUNITY): Admission: RE | Admit: 2013-01-20 | Payer: Medicare Other | Source: Ambulatory Visit

## 2014-02-06 ENCOUNTER — Other Ambulatory Visit (HOSPITAL_COMMUNITY): Payer: Self-pay | Admitting: Interventional Radiology

## 2014-02-06 DIAGNOSIS — I671 Cerebral aneurysm, nonruptured: Secondary | ICD-10-CM

## 2014-02-06 DIAGNOSIS — I771 Stricture of artery: Secondary | ICD-10-CM

## 2014-02-22 ENCOUNTER — Encounter (HOSPITAL_COMMUNITY): Payer: Self-pay

## 2014-02-22 ENCOUNTER — Ambulatory Visit (HOSPITAL_COMMUNITY): Payer: Medicare Other

## 2014-02-22 ENCOUNTER — Ambulatory Visit (HOSPITAL_COMMUNITY)
Admission: RE | Admit: 2014-02-22 | Discharge: 2014-02-22 | Disposition: A | Discharge status: Home / Self Care | Payer: Medicare Other | Source: Ambulatory Visit | Attending: Interventional Radiology | Admitting: Interventional Radiology

## 2014-02-22 DIAGNOSIS — I671 Cerebral aneurysm, nonruptured: Secondary | ICD-10-CM | POA: Diagnosis present

## 2014-02-22 DIAGNOSIS — I739 Peripheral vascular disease, unspecified: Secondary | ICD-10-CM | POA: Diagnosis not present

## 2014-02-22 DIAGNOSIS — I771 Stricture of artery: Secondary | ICD-10-CM | POA: Diagnosis not present

## 2014-02-22 HISTORY — DX: Essential (primary) hypertension: I10

## 2014-02-22 LAB — CREATININE, SERUM
Creatinine, Ser: 0.94 mg/dL (ref 0.50–1.10)
GFR calc Af Amer: 81 mL/min — ABNORMAL LOW (ref >90)
GFR, EST NON AFRICAN AMERICAN: 70 mL/min — AB (ref >90)

## 2014-02-22 MED ORDER — GADOBENATE DIMEGLUMINE 529 MG/ML IV SOLN
15.0000 mL | Freq: Once | INTRAVENOUS | Status: AC | PRN
  Administered 2014-02-22: 15 mL via INTRAVENOUS

## 2014-03-06 ENCOUNTER — Telehealth (HOSPITAL_COMMUNITY): Payer: Self-pay | Admitting: Interventional Radiology

## 2014-03-06 NOTE — Telephone Encounter (Signed)
Pt called to ask about her MRI results. I told her per Deveshwar that she would need to follow-up in 2 years time with MRI/MRA. Pt states understanding and is in agreement with this plan of care. JM

## 2015-05-08 ENCOUNTER — Other Ambulatory Visit (HOSPITAL_COMMUNITY): Payer: Self-pay | Admitting: Interventional Radiology

## 2015-05-08 DIAGNOSIS — Z09 Encounter for follow-up examination after completed treatment for conditions other than malignant neoplasm: Secondary | ICD-10-CM

## 2015-05-08 DIAGNOSIS — R51 Headache: Principal | ICD-10-CM

## 2015-05-08 DIAGNOSIS — R519 Headache, unspecified: Secondary | ICD-10-CM

## 2015-05-21 ENCOUNTER — Ambulatory Visit (HOSPITAL_COMMUNITY): Admission: RE | Admit: 2015-05-21 | Payer: Medicare Other | Source: Ambulatory Visit

## 2015-05-31 ENCOUNTER — Ambulatory Visit (HOSPITAL_COMMUNITY): Payer: Medicare Other

## 2015-10-15 ENCOUNTER — Ambulatory Visit: Payer: Medicare Other

## 2015-12-06 ENCOUNTER — Ambulatory Visit: Payer: Medicare Other | Admitting: Audiology

## 2015-12-10 ENCOUNTER — Ambulatory Visit: Payer: Medicare Other | Attending: Audiology | Admitting: Audiology

## 2016-02-22 ENCOUNTER — Telehealth (HOSPITAL_COMMUNITY): Payer: Self-pay

## 2016-02-22 NOTE — Telephone Encounter (Signed)
Called to schedule MRI, no answer. AW

## 2016-02-25 ENCOUNTER — Other Ambulatory Visit (HOSPITAL_COMMUNITY): Payer: Self-pay | Admitting: Interventional Radiology

## 2016-02-25 DIAGNOSIS — I771 Stricture of artery: Secondary | ICD-10-CM

## 2016-02-25 DIAGNOSIS — I729 Aneurysm of unspecified site: Secondary | ICD-10-CM

## 2016-03-05 ENCOUNTER — Ambulatory Visit (HOSPITAL_COMMUNITY)
Admission: RE | Admit: 2016-03-05 | Discharge: 2016-03-05 | Disposition: A | Discharge status: Home / Self Care | Payer: Medicare Other | Source: Ambulatory Visit | Attending: Interventional Radiology | Admitting: Interventional Radiology

## 2016-03-05 ENCOUNTER — Ambulatory Visit (HOSPITAL_COMMUNITY): Admission: RE | Admit: 2016-03-05 | Payer: Medicare Other | Source: Ambulatory Visit

## 2016-03-05 DIAGNOSIS — I671 Cerebral aneurysm, nonruptured: Secondary | ICD-10-CM | POA: Diagnosis not present

## 2016-03-05 DIAGNOSIS — I729 Aneurysm of unspecified site: Secondary | ICD-10-CM | POA: Diagnosis present

## 2016-03-05 DIAGNOSIS — I771 Stricture of artery: Secondary | ICD-10-CM | POA: Insufficient documentation

## 2016-03-05 MED ORDER — GADOBENATE DIMEGLUMINE 529 MG/ML IV SOLN
15.0000 mL | Freq: Once | INTRAVENOUS | Status: AC | PRN
  Administered 2016-03-05: 15 mL via INTRAVENOUS

## 2016-03-06 ENCOUNTER — Telehealth (HOSPITAL_COMMUNITY): Payer: Self-pay

## 2016-03-06 NOTE — Telephone Encounter (Signed)
Called, no answer. AW

## 2016-03-07 ENCOUNTER — Telehealth (HOSPITAL_COMMUNITY): Payer: Self-pay

## 2016-03-07 NOTE — Telephone Encounter (Signed)
Pt agreed to f/u in 2 yrs with mri/mra. AW

## 2016-06-21 ENCOUNTER — Ambulatory Visit (INDEPENDENT_AMBULATORY_CARE_PROVIDER_SITE_OTHER): Payer: Medicare Other | Admitting: Otolaryngology

## 2016-06-23 ENCOUNTER — Ambulatory Visit (INDEPENDENT_AMBULATORY_CARE_PROVIDER_SITE_OTHER): Payer: Medicare Other | Admitting: Otolaryngology

## 2017-07-08 ENCOUNTER — Other Ambulatory Visit (HOSPITAL_BASED_OUTPATIENT_CLINIC_OR_DEPARTMENT_OTHER): Payer: Self-pay

## 2017-07-08 DIAGNOSIS — G4733 Obstructive sleep apnea (adult) (pediatric): Secondary | ICD-10-CM

## 2017-07-14 ENCOUNTER — Ambulatory Visit: Payer: Medicare Other | Attending: Neurology | Admitting: Neurology

## 2017-07-14 ENCOUNTER — Encounter (INDEPENDENT_AMBULATORY_CARE_PROVIDER_SITE_OTHER): Payer: Self-pay

## 2017-07-14 DIAGNOSIS — Z8669 Personal history of other diseases of the nervous system and sense organs: Secondary | ICD-10-CM | POA: Insufficient documentation

## 2017-07-14 DIAGNOSIS — Z09 Encounter for follow-up examination after completed treatment for conditions other than malignant neoplasm: Secondary | ICD-10-CM | POA: Insufficient documentation

## 2017-07-14 DIAGNOSIS — G4733 Obstructive sleep apnea (adult) (pediatric): Secondary | ICD-10-CM

## 2017-07-17 NOTE — Procedures (Signed)
[facility] Faduma Cho A. Gerilyn Pilgrimoonquah, MD     [email or web address]             NOCTURNAL POLYSOMNOGRAPHY   LOCATION: [facility]  Patient Name: Michele Barrett, [name] Study Date: 07/14/2017 Gender: Female D.O.B: 12/26/1965 Age (years): [age] Referring Provider: Beryle BeamsKofi Nicholes Hibler MD, ABSM Height (inches): 61 Interpreting Physician: Beryle BeamsKofi Jezebelle Ledwell MD, ABSM Weight (lbs): 168 RPSGT: Alfonso EllisHedrick, Debra BMI: 32 MRN: [id number] Neck Size: 18.00 <br> <br> CLINICAL INFORMATION Sleep Study Type: NPSG    Indication for sleep study: N/A    Epworth Sleepiness Score: 6    SLEEP STUDY TECHNIQUE As per the AASM Manual for the Scoring of Sleep and Associated Events v2.3 (April 2016) with a hypopnea requiring 4% desaturations.  The channels recorded and monitored were frontal, central and occipital EEG, electrooculogram (EOG), submentalis EMG (chin), nasal and oral airflow, thoracic and abdominal wall motion, anterior tibialis EMG, snore microphone, electrocardiogram, and pulse oximetry.  MEDICATIONS Medications self-administered by patient taken the night of the study : N/A  Current Outpatient Medications:  .  ALPRAZolam (XANAX) 0.5 MG tablet, Take 0.5 mg by mouth at bedtime as needed for sleep. , Disp: , Rfl:  .  amitriptyline (ELAVIL) 100 MG tablet, Take 100 mg by mouth at bedtime.  , Disp: , Rfl:  .  bisacodyl (BISACODYL) 5 MG EC tablet, Take 5 mg by mouth daily as needed for mild constipation. , Disp: , Rfl:  .  buPROPion (WELLBUTRIN SR) 100 MG 12 hr tablet, Take 100 mg by mouth 2 (two) times daily.  , Disp: , Rfl:  .  lisinopril (PRINIVIL,ZESTRIL) 10 MG tablet, Take 10 mg by mouth daily., Disp: , Rfl:  .  meclizine (ANTIVERT) 25 MG tablet, Take 25 mg by mouth 3 (three) times daily as needed for dizziness. , Disp: , Rfl:  .  zolpidem (AMBIEN) 5 MG tablet, Take 5 mg by mouth at bedtime as needed for sleep., Disp: , Rfl:     SLEEP ARCHITECTURE The study was initiated at 10:52:07 PM and  ended at 5:22:50 AM.  Sleep onset time was 11.3 minutes and the sleep efficiency was 84.3%%. The total sleep time was 329.5 minutes.  Stage REM latency was 122.0 minutes.  The patient spent 3.3%% of the night in stage N1 sleep, 77.7%% in stage N2 sleep, 15.3%% in stage N3 and 3.64% in REM.  Alpha intrusion was absent.  Supine sleep was 6.48%.  RESPIRATORY PARAMETERS The overall apnea/hypopnea index (AHI) was 0.0 per hour. There were 0 total apneas, including 0 obstructive, 0 central and 0 mixed apneas. There were 0 hypopneas and 4 RERAs.  The AHI during Stage REM sleep was 0.0 per hour.  AHI while supine was 0.0 per hour.  The mean oxygen saturation was 99.9%. The minimum SpO2 during sleep was 98.0%.  moderate snoring was noted during this study.  CARDIAC DATA The 2 lead EKG demonstrated sinus rhythm. The mean heart rate was N/A beats per minute. Other EKG findings include: None. LEG MOVEMENT DATA MIld PLMS by visional inspection is note. Associated arousal with leg movement index was 4.4.  IMPRESSIONS - No significant obstructive sleep apnea occurred during this study. - No significant central sleep apnea occurred during this study. - The patient had minimal or no oxygen desaturation. - The patient snored with moderate snoring volume. - No cardiac abnormalities were noted during this study. 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   Argie RammingKofi A Tyler Cubit, MD Diplomate, American  Board of Sleep Medicine.   ELECTRONICALLY SIGNED ON:  07/17/2017, 1:17 PM [facility] PH: [phone]   FX: [phone] ACCREDITED BY THE AMERICAN ACADEMY OF SLEEP MEDICINE
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
## 2017-08-25 ENCOUNTER — Ambulatory Visit: Payer: Medicare Other | Admitting: Cardiology

## 2017-08-25 ENCOUNTER — Encounter: Payer: Self-pay | Admitting: *Deleted

## 2017-08-25 NOTE — Progress Notes (Deleted)
Clinical Summary [name] is a 52 y.o.female seen as new consult  1. DOE   Past Medical History:  Diagnosis Date  . Anorexia nervosa    6 years ago diagnosed. wt stable,  . Cervical disc syndrome   . Depression   . GERD (gastroesophageal reflux disease)   . History of migraine headaches   . Hypertension   . IDA (iron deficiency anemia)   . PTSD (post-traumatic stress disorder)    since sister's death in 1994 (aneurysm)  . Vitamin D deficiency      Allergies  Allergen Reactions  . Aspirin Nausea And Vomiting     Current Outpatient Medications  Medication Sig Dispense Refill  . ALPRAZolam (XANAX) 0.5 MG tablet Take 0.5 mg by mouth at bedtime as needed for sleep.     Marland Kitchen. amitriptyline (ELAVIL) 100 MG tablet Take 100 mg by mouth at bedtime.      . bisacodyl (BISACODYL) 5 MG EC tablet Take 5 mg by mouth daily as needed for mild constipation.     Marland Kitchen. buPROPion (WELLBUTRIN SR) 100 MG 12 hr tablet Take 100 mg by mouth 2 (two) times daily.      Marland Kitchen. lisinopril (PRINIVIL,ZESTRIL) 10 MG tablet Take 10 mg by mouth daily.    . meclizine (ANTIVERT) 25 MG tablet Take 25 mg by mouth 3 (three) times daily as needed for dizziness.     Marland Kitchen. zolpidem (AMBIEN) 5 MG tablet Take 5 mg by mouth at bedtime as needed for sleep.     No current facility-administered medications for this visit.      Past Surgical History:  Procedure Laterality Date  . CEREBRAL ANEURYSM REPAIR     Dec 2011  . CESAREAN SECTION     uterus stapled to abd wall, resulting in surgery few days later; cardiac arrest intraop     Allergies  Allergen Reactions  . Aspirin Nausea And Vomiting      Family History  Problem Relation Age of Onset  . Colon cancer Mother 2260  . Colon cancer Father 1259  . Breast cancer Maternal Aunt   . Cerebral aneurysm Sister 4143     Social History [name] reports that she has never smoked. She does not have any smokeless tobacco history on file. [name] reports that she does not drink  alcohol.   Review of Systems CONSTITUTIONAL: No weight loss, fever, chills, weakness or fatigue.  HEENT: Eyes: No visual loss, blurred vision, double vision or yellow sclerae.No hearing loss, sneezing, congestion, runny nose or sore throat.  SKIN: No rash or itching.  CARDIOVASCULAR:  RESPIRATORY: No shortness of breath, cough or sputum.  GASTROINTESTINAL: No anorexia, nausea, vomiting or diarrhea. No abdominal pain or blood.  GENITOURINARY: No burning on urination, no polyuria NEUROLOGICAL: No headache, dizziness, syncope, paralysis, ataxia, numbness or tingling in the extremities. No change in bowel or bladder control.  MUSCULOSKELETAL: No muscle, back pain, joint pain or stiffness.  LYMPHATICS: No enlarged nodes. No history of splenectomy.  PSYCHIATRIC: No history of depression or anxiety.  ENDOCRINOLOGIC: No reports of sweating, cold or heat intolerance. No polyuria or polydipsia.  Marland Kitchen.   Physical Examination There were no vitals filed for this visit. There were no vitals filed for this visit.  Gen: resting comfortably, no acute distress HEENT: no scleral icterus, pupils equal round and reactive, no palptable cervical adenopathy,  CV Resp: Clear to auscultation bilaterally GI: abdomen is soft, non-tender, non-distended, normal bowel sounds, no hepatosplenomegaly MSK: extremities  are warm, no edema.  Skin: warm, no rash Neuro:  no focal deficits Psych: appropriate affect   Diagnostic Studies     Assessment and Plan        Antoine Poche, M.D., F.A.C.C.

## 2017-08-26 ENCOUNTER — Encounter: Payer: Self-pay | Admitting: Cardiology

## 2017-08-27 ENCOUNTER — Encounter (HOSPITAL_COMMUNITY): Payer: Self-pay | Admitting: Psychiatry

## 2017-08-27 ENCOUNTER — Encounter (INDEPENDENT_AMBULATORY_CARE_PROVIDER_SITE_OTHER): Payer: Self-pay

## 2017-08-27 ENCOUNTER — Ambulatory Visit (INDEPENDENT_AMBULATORY_CARE_PROVIDER_SITE_OTHER): Payer: Medicare Other | Admitting: Psychiatry

## 2017-08-27 ENCOUNTER — Other Ambulatory Visit (HOSPITAL_COMMUNITY): Payer: Self-pay | Admitting: Psychiatry

## 2017-08-27 VITALS — BP 142/87 | HR 82 | Ht 61.0 in | Wt 164.6 lb

## 2017-08-27 DIAGNOSIS — F331 Major depressive disorder, recurrent, moderate: Secondary | ICD-10-CM | POA: Diagnosis not present

## 2017-08-27 DIAGNOSIS — F4310 Post-traumatic stress disorder, unspecified: Secondary | ICD-10-CM

## 2017-08-27 MED ORDER — BUPROPION HCL ER (XL) 150 MG PO TB24: 150.0000 mg | ORAL_TABLET | Freq: Every day | ORAL | 0 refills | Status: DC

## 2017-08-27 MED ORDER — ALPRAZOLAM 0.5 MG PO TABS: 0.5000 mg | ORAL_TABLET | Freq: Every evening | ORAL | 0 refills | Status: AC | PRN

## 2017-08-27 MED ORDER — AMITRIPTYLINE HCL 100 MG PO TABS: ORAL_TABLET | ORAL | 0 refills | Status: DC

## 2017-08-27 NOTE — Patient Instructions (Signed)
1. Reinitiate amytriptyline 50 mg at night for one week, then 100 mg daily  2. Increase Wellbutrin 150 mg daily  3. Continue Xanax 0.5mg  at night as needed for sleep  4. Return to clinic in one month for 30 mins
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
## 2017-10-08 ENCOUNTER — Ambulatory Visit (HOSPITAL_COMMUNITY): Payer: Self-pay | Admitting: Psychiatry

## 2017-11-23 ENCOUNTER — Telehealth (HOSPITAL_COMMUNITY): Payer: Self-pay | Admitting: Psychiatry

## 2017-11-23 NOTE — Telephone Encounter (Signed)
Called Patient  & had to LVM per Provider: Received medication refill request. Contacted  the patient to make follow up appointment (will let provider know so that she can order refill). Please also notify of no show policy.

## 2017-11-23 NOTE — Telephone Encounter (Signed)
Received medication refill request. Please contact the patient to make follow up appointment (then let me know so that I can order refill). Please also notify of no show policy.

## 2017-11-25 ENCOUNTER — Telehealth (HOSPITAL_COMMUNITY): Payer: Self-pay | Admitting: *Deleted

## 2017-11-25 ENCOUNTER — Other Ambulatory Visit (HOSPITAL_COMMUNITY): Payer: Self-pay | Admitting: Psychiatry

## 2017-11-25 MED ORDER — AMITRIPTYLINE HCL 100 MG PO TABS: 100.0000 mg | ORAL_TABLET | Freq: Every day | ORAL | 0 refills | Status: AC

## 2017-11-25 MED ORDER — BUPROPION HCL ER (XL) 150 MG PO TB24: 150.0000 mg | ORAL_TABLET | Freq: Every day | ORAL | 0 refills | Status: AC

## 2017-11-25 NOTE — Telephone Encounter (Signed)
Dr Vanetta Shawl Patient called to  Let us know that she has relocated to the [facility] area. And that she is asking for medication refills on her med's & a REFERRAL to [facility]  [address]  [place] [address] # [phone]

## 2017-11-25 NOTE — Telephone Encounter (Signed)
Which pharmacy? I will do 90 days refill. Please inform the patient that I will not be able to do any more refills without evaluation. Please make referral (send record if needed after obtaining consent from the patient) to that practice.

## 2017-11-25 NOTE — Telephone Encounter (Signed)
I will not be able to order Xanax across the sate, and also do not feel comfortable prescribing it without evaluation. Advise her to see primary care doctor if she is unable to see psychiatrist.

## 2017-11-25 NOTE — Telephone Encounter (Signed)
Called the [facility] to get referral information needed. Collected & Sent

## 2017-11-25 NOTE — Telephone Encounter (Signed)
Dr Vanetta Shawl  Patient called & no refill was sent in for the ALPRAZolam Prudy Feeler) 0.5 MG tablet

## 2017-11-25 NOTE — Telephone Encounter (Signed)
Ordered

## 2017-11-25 NOTE — Telephone Encounter (Signed)
It's been update in the system [facility] in [place]

## 2018-02-16 ENCOUNTER — Telehealth (HOSPITAL_COMMUNITY): Payer: Self-pay

## 2018-02-16 ENCOUNTER — Other Ambulatory Visit (HOSPITAL_COMMUNITY): Payer: Self-pay | Admitting: Interventional Radiology

## 2018-02-16 DIAGNOSIS — I729 Aneurysm of unspecified site: Secondary | ICD-10-CM

## 2018-02-16 DIAGNOSIS — R519 Headache, unspecified: Secondary | ICD-10-CM

## 2018-02-16 DIAGNOSIS — R51 Headache: Secondary | ICD-10-CM

## 2018-02-16 NOTE — Telephone Encounter (Signed)
Called to schedule 2 year f/u mri, no answer, left vm. AW

## 2018-03-01 ENCOUNTER — Ambulatory Visit (HOSPITAL_COMMUNITY): Payer: Medicare HMO

## 2018-03-03 ENCOUNTER — Ambulatory Visit (HOSPITAL_COMMUNITY): Payer: Medicare HMO

## 2018-03-03 ENCOUNTER — Other Ambulatory Visit (HOSPITAL_COMMUNITY): Payer: Medicare HMO

## 2018-03-25 ENCOUNTER — Ambulatory Visit (HOSPITAL_COMMUNITY): Payer: Medicare HMO

## 2018-04-30 ENCOUNTER — Ambulatory Visit (HOSPITAL_COMMUNITY): Payer: Medicare HMO

## 2018-04-30 ENCOUNTER — Ambulatory Visit (HOSPITAL_COMMUNITY): Admission: RE | Admit: 2018-04-30 | Payer: Medicare HMO | Source: Ambulatory Visit

## 2018-04-30 ENCOUNTER — Encounter (HOSPITAL_COMMUNITY): Payer: Self-pay

## 2018-06-07 IMAGING — MR MR HEAD WO/W CM
10 of 15 series · 29 of 48 positions shown · IV contrast (Yes   MULTIHANCE)
Comparison: 02/22/2014

CLINICAL DATA: Followup treated posterior communicating artery
aneurysm on the right.

EXAM:
MRI HEAD WITHOUT AND WITH CONTRAST
MRA HEAD WITHOUT AND WITH CONTRAST
TECHNIQUE: Multiplanar, multiecho pulse sequences of the brain and surrounding
structures were obtained without and with intravenous contrast.
Angiographic images of the head were obtained using MRA technique
without and with contrast.
CONTRAST:  15mL MULTIHANCE GADOBENATE DIMEGLUMINE 529 MG/ML IV SOLN

[Series 3: T1 · sagittal · 5.0mm · 0.47mm/px · 1 of 23 slices shown]
[im 1/23]
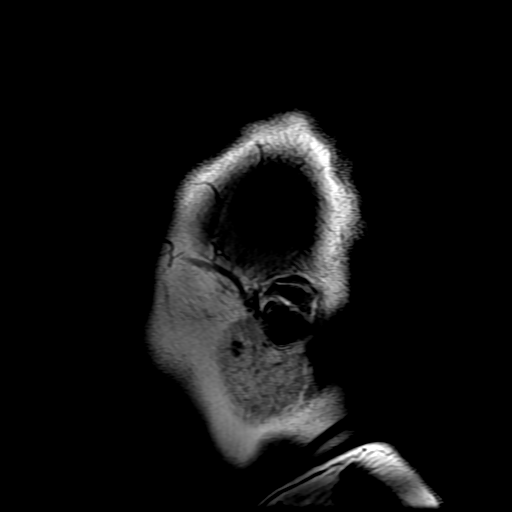

[Series 4: DWI · axial · 3.0mm · 1.09mm/px · z∈[-93,+37]mm · 5 of 92 slices shown (1 of 4)]
[im 1/92]
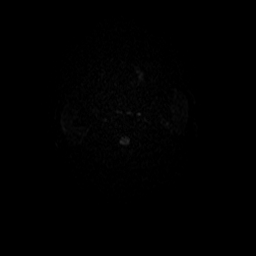
[im 23/92]
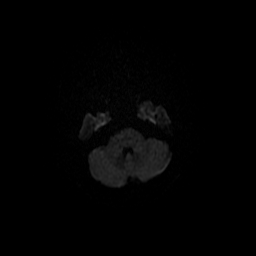
[im 46/92]
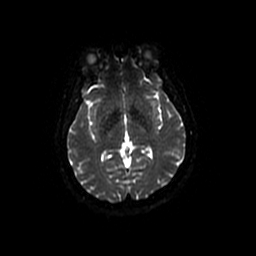
[im 69/92]
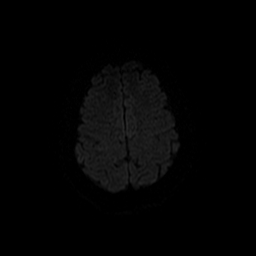
[im 92/92]
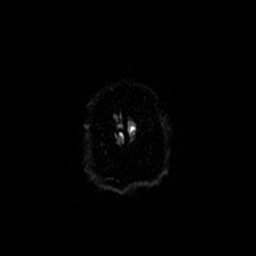

[Series 5: FLAIR · axial · 5.0mm · 0.43mm/px · 1 of 22 slices shown]
[im 1/22]
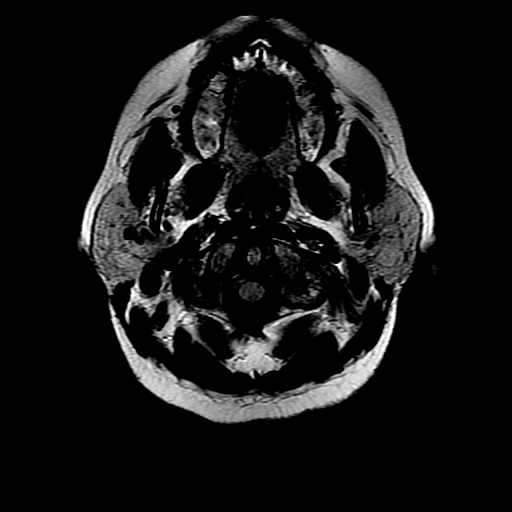

[Series 6: T2 · axial · 5.0mm · 0.43mm/px · 1 of 26 slices shown]
[im 1/26]
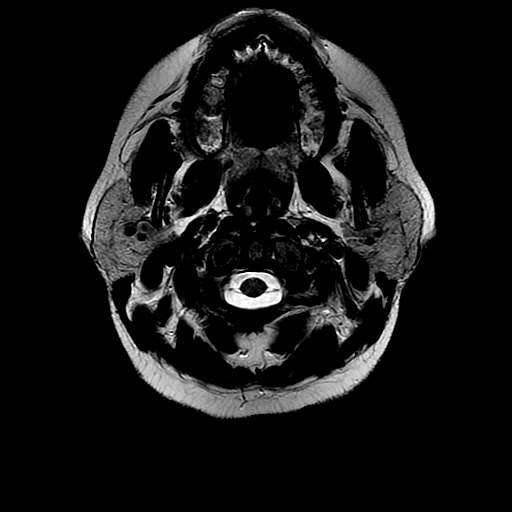

[Series 7: DWI · coronal · 5.0mm · 1.09mm/px · 5 of 66 slices shown (2 of 4)]
[im 1/66]
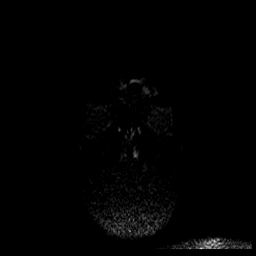
[im 17/66]
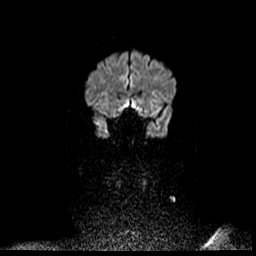
[im 33/66]
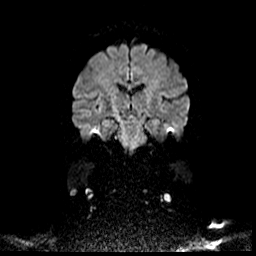
[im 49/66]
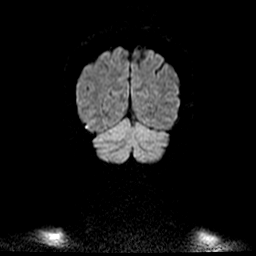
[im 66/66]
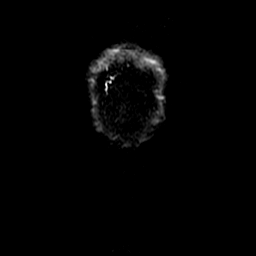

[Series 8: (id) mt fs · axial · 1.4mm · 0.43mm/px · z∈[-81,+0]mm · 7 of 152 slices shown]
[im 1/152]
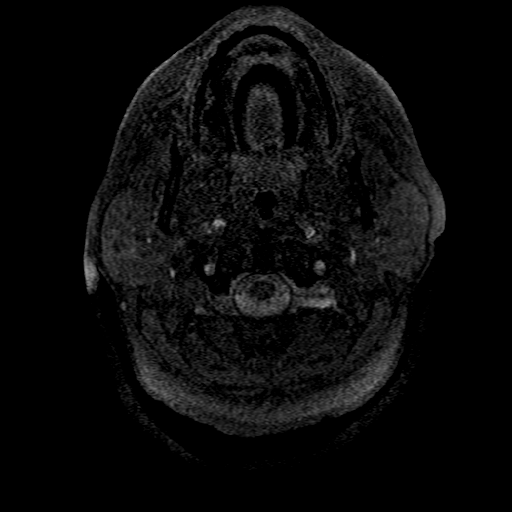
[im 31/152]
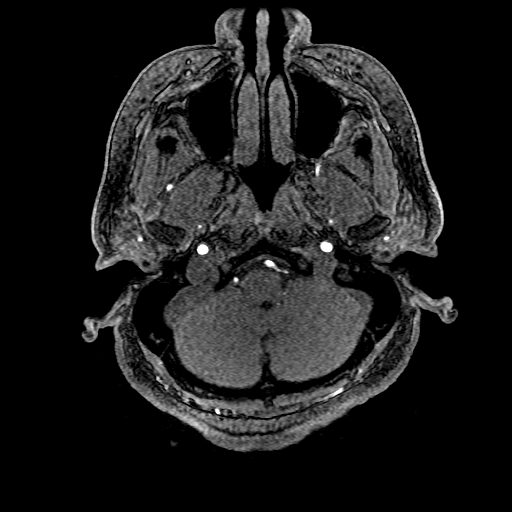
[im 46/152]
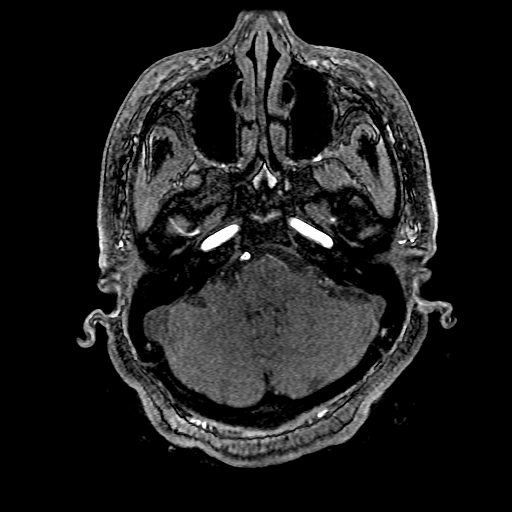
[im 61/152]
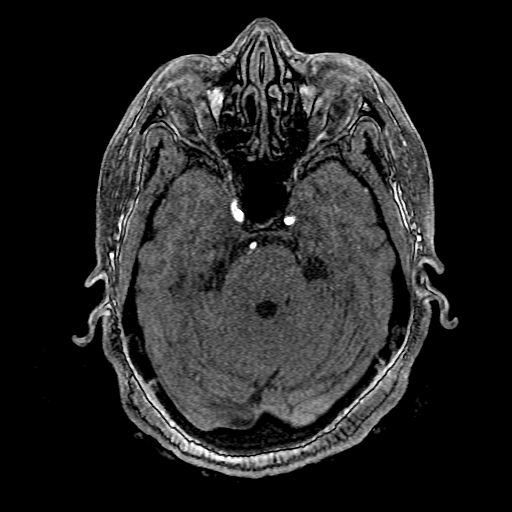
[im 91/152]
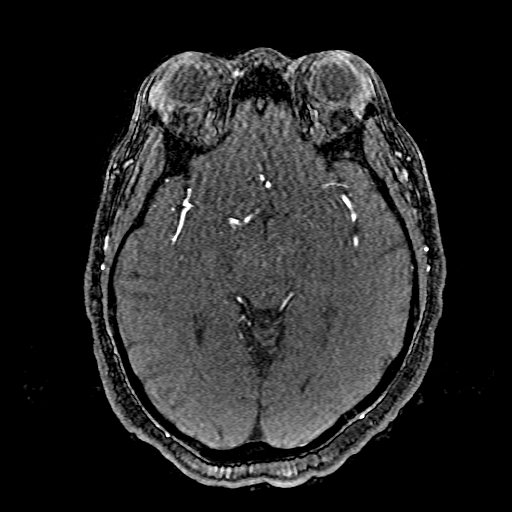
[im 106/152]
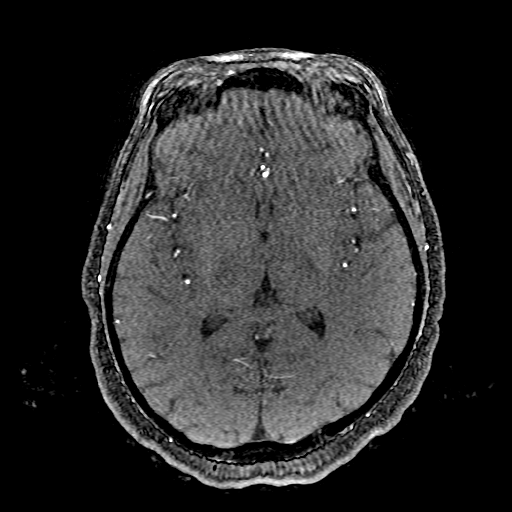
[im 121/152]
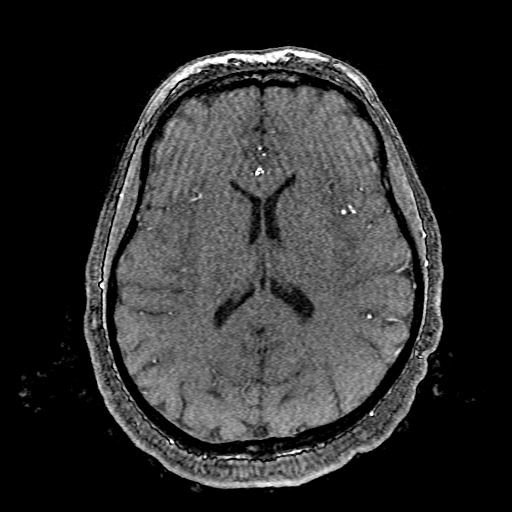

[Series 11: T2 post-contrast · coronal · 5.0mm · 0.39mm/px · 2 of 25 slices shown]
[im 1/25]
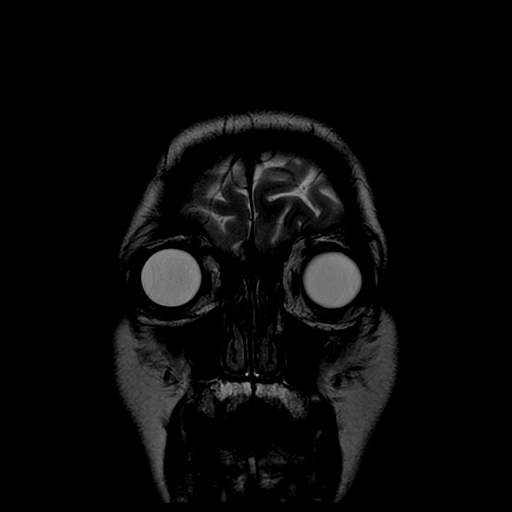
[im 25/25]
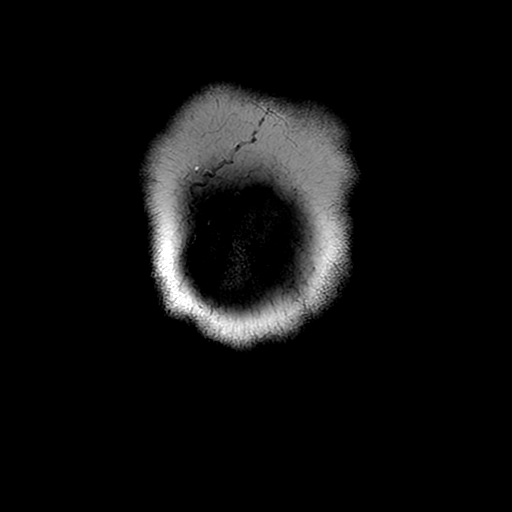

[Series 13: T1 post-contrast · coronal · 5.0mm · 0.39mm/px · 2 of 25 slices shown]
[im 1/25]
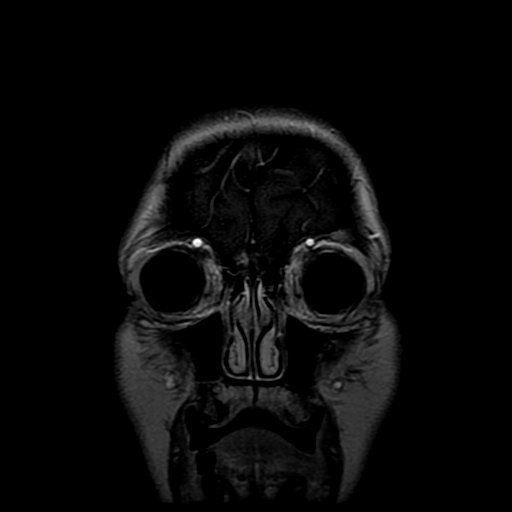
[im 25/25]
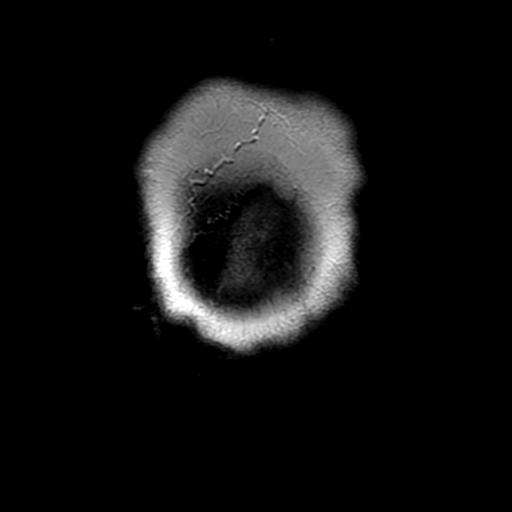

[Series 400: DWI · axial · 3.0mm · 1.09mm/px · z∈[-93,+37]mm · 3 of 46 slices shown (3 of 4)]
[im 1/46]
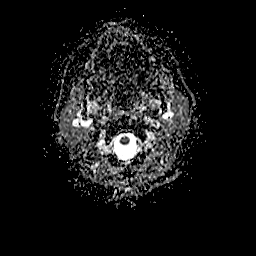
[im 23/46]
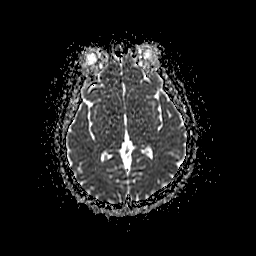
[im 46/46]
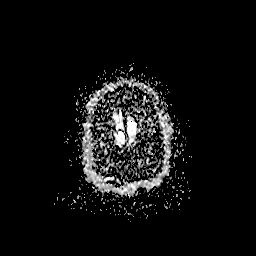

[Series 700: DWI · coronal · 5.0mm · 1.09mm/px · 2 of 33 slices shown (4 of 4)]
[im 1/33]
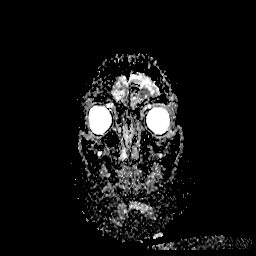
[im 33/33]
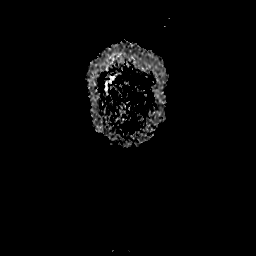

[29 of 48 positions shown; findings below may reference images not displayed]

FINDINGS: MRI HEAD FINDINGS

Brain: The brain has normal appearance without evidence of
malformation, atrophy, old or acute small or large vessel
infarction, hemorrhage, hydrocephalus or extra-axial collection. No
pituitary abnormality. No abnormal contrast enhancement

Vascular: Major vessels at the base of the brain show flow.

Skull and upper cervical spine: Normal

Sinuses/Orbits: Clear/ normal.

Other: None significant.

MRA HEAD FINDINGS

Both internal carotid arteries are widely patent into the brain.
There is signal loss in the right supraclinoid ICA region secondary
to the presence of the stent. No evidence of residual or recurrent
flow in the treated right posterior communicating artery aneurysm.
Right anterior and middle cerebral arteries are widely patent. No
aneurysm or vascular finding on the left. Patent posterior
communicating artery, with likely infundibulum.

Both vertebral arteries are widely patent to the basilar. No basilar
stenosis. Posterior circulation branch vessels are normal.
IMPRESSION: Normal appearance of the brain as seen previously.

Right supraclinoid ICA stent and core old right posterior
communicating aneurysm. No evidence of residual or recurrent
aneurysm flow.

Infundibulum left posterior communicating artery origin appears the
same.

## 2018-07-01 ENCOUNTER — Telehealth (HOSPITAL_COMMUNITY): Payer: Self-pay

## 2018-07-01 NOTE — Telephone Encounter (Signed)
Called to reschedule mri/mra, no answer, left vm for pt to return call. AW

## 2019-07-11 ENCOUNTER — Encounter: Payer: Self-pay | Admitting: Gastroenterology

## 2020-05-14 ENCOUNTER — Other Ambulatory Visit (HOSPITAL_COMMUNITY): Payer: Self-pay | Admitting: Interventional Radiology

## 2020-05-14 DIAGNOSIS — I671 Cerebral aneurysm, nonruptured: Secondary | ICD-10-CM

## 2020-05-24 ENCOUNTER — Telehealth (HOSPITAL_COMMUNITY): Payer: Self-pay

## 2020-05-24 NOTE — Telephone Encounter (Signed)
Called to reschedule consult, no answer, no vm. AW  

## 2020-05-25 ENCOUNTER — Telehealth (HOSPITAL_COMMUNITY): Payer: Self-pay

## 2020-05-25 ENCOUNTER — Ambulatory Visit (HOSPITAL_COMMUNITY): Admission: RE | Admit: 2020-05-25 | Payer: 59 | Source: Ambulatory Visit

## 2020-05-25 NOTE — Telephone Encounter (Signed)
Called to reschedule consult, no answer, no vm. AW  

## 2020-06-13 ENCOUNTER — Ambulatory Visit (HOSPITAL_COMMUNITY): Payer: 59

## 2020-06-15 ENCOUNTER — Ambulatory Visit (HOSPITAL_COMMUNITY): Admission: RE | Admit: 2020-06-15 | Payer: 59 | Source: Ambulatory Visit

## 2020-06-29 ENCOUNTER — Ambulatory Visit (HOSPITAL_COMMUNITY): Admission: RE | Admit: 2020-06-29 | Payer: 59 | Source: Ambulatory Visit

## 2020-07-30 ENCOUNTER — Telehealth (HOSPITAL_COMMUNITY): Payer: Self-pay

## 2020-07-30 NOTE — Telephone Encounter (Signed)
Returned pt's call, no answer, no vm. AW 

## 2020-08-22 ENCOUNTER — Ambulatory Visit (HOSPITAL_COMMUNITY): Payer: 59

## 2020-09-27 ENCOUNTER — Ambulatory Visit (HOSPITAL_COMMUNITY): Payer: 59

## 2023-09-15 ENCOUNTER — Encounter (HOSPITAL_COMMUNITY): Payer: Self-pay
